# Patient Record
Sex: Female | Born: 1975
Health system: Southern US, Community
[De-identification: ages and names within clinical notes are randomized; demographics above are authoritative.]

## PROBLEM LIST (undated history)

## (undated) DIAGNOSIS — IMO0001 Reserved for inherently not codable concepts without codable children: Secondary | ICD-10-CM

## (undated) DIAGNOSIS — Z332 Encounter for elective termination of pregnancy: Secondary | ICD-10-CM

## (undated) DIAGNOSIS — K219 Gastro-esophageal reflux disease without esophagitis: Secondary | ICD-10-CM

## (undated) DIAGNOSIS — N390 Urinary tract infection, site not specified: Secondary | ICD-10-CM

## (undated) DIAGNOSIS — Z3189 Encounter for other procreative management: Secondary | ICD-10-CM

## (undated) DIAGNOSIS — R896 Abnormal cytological findings in specimens from other organs, systems and tissues: Secondary | ICD-10-CM

## (undated) DIAGNOSIS — F329 Major depressive disorder, single episode, unspecified: Secondary | ICD-10-CM

## (undated) DIAGNOSIS — N970 Female infertility associated with anovulation: Secondary | ICD-10-CM

## (undated) DIAGNOSIS — J302 Other seasonal allergic rhinitis: Secondary | ICD-10-CM

## (undated) DIAGNOSIS — F419 Anxiety disorder, unspecified: Secondary | ICD-10-CM

## (undated) DIAGNOSIS — O26899 Other specified pregnancy related conditions, unspecified trimester: Secondary | ICD-10-CM

## (undated) DIAGNOSIS — IMO0002 Reserved for concepts with insufficient information to code with codable children: Secondary | ICD-10-CM

## (undated) DIAGNOSIS — R12 Heartburn: Secondary | ICD-10-CM

## (undated) DIAGNOSIS — F32A Depression, unspecified: Secondary | ICD-10-CM

## (undated) DIAGNOSIS — O021 Missed abortion: Secondary | ICD-10-CM

## (undated) DIAGNOSIS — R87619 Unspecified abnormal cytological findings in specimens from cervix uteri: Secondary | ICD-10-CM

## (undated) DIAGNOSIS — K649 Unspecified hemorrhoids: Secondary | ICD-10-CM

## (undated) HISTORY — PX: WISDOM TOOTH EXTRACTION: SHX21

## (undated) HISTORY — PX: INDUCED ABORTION: SHX677

## (undated) SURGERY — Surgical Case
Anesthesia: *Unknown

---

## 1998-12-01 ENCOUNTER — Other Ambulatory Visit: Admission: RE | Admit: 1998-12-01 | Discharge: 1998-12-01 | Payer: Self-pay | Admitting: Obstetrics and Gynecology

## 1999-04-28 ENCOUNTER — Other Ambulatory Visit: Admission: RE | Admit: 1999-04-28 | Discharge: 1999-04-28 | Payer: Self-pay | Admitting: Obstetrics and Gynecology

## 2002-03-17 ENCOUNTER — Emergency Department (HOSPITAL_COMMUNITY): Admission: EM | Admit: 2002-03-17 | Discharge: 2002-03-17 | Payer: Self-pay | Admitting: Emergency Medicine

## 2002-09-21 ENCOUNTER — Emergency Department (HOSPITAL_COMMUNITY): Admission: EM | Admit: 2002-09-21 | Discharge: 2002-09-21 | Payer: Self-pay | Admitting: Emergency Medicine

## 2002-09-21 ENCOUNTER — Encounter: Payer: Self-pay | Admitting: Emergency Medicine

## 2003-02-16 ENCOUNTER — Other Ambulatory Visit: Admission: RE | Admit: 2003-02-16 | Discharge: 2003-02-16 | Payer: Self-pay | Admitting: Obstetrics & Gynecology

## 2004-01-28 ENCOUNTER — Other Ambulatory Visit: Admission: RE | Admit: 2004-01-28 | Discharge: 2004-01-28 | Payer: Self-pay | Admitting: Obstetrics & Gynecology

## 2006-10-26 ENCOUNTER — Emergency Department (HOSPITAL_COMMUNITY): Admission: EM | Admit: 2006-10-26 | Discharge: 2006-10-26 | Payer: Self-pay | Admitting: Emergency Medicine

## 2011-02-20 ENCOUNTER — Other Ambulatory Visit (HOSPITAL_COMMUNITY): Payer: Self-pay | Admitting: Obstetrics & Gynecology

## 2011-02-20 DIAGNOSIS — N979 Female infertility, unspecified: Secondary | ICD-10-CM

## 2011-02-27 ENCOUNTER — Ambulatory Visit (HOSPITAL_COMMUNITY)
Admission: RE | Admit: 2011-02-27 | Discharge: 2011-02-27 | Disposition: A | Discharge status: Home / Self Care | Payer: PRIVATE HEALTH INSURANCE | Source: Ambulatory Visit | Attending: Obstetrics & Gynecology | Admitting: Obstetrics & Gynecology

## 2011-02-27 DIAGNOSIS — N979 Female infertility, unspecified: Secondary | ICD-10-CM | POA: Insufficient documentation

## 2012-02-20 ENCOUNTER — Encounter: Payer: PRIVATE HEALTH INSURANCE | Attending: Obstetrics & Gynecology | Admitting: Dietician

## 2012-02-20 DIAGNOSIS — Z713 Dietary counseling and surveillance: Secondary | ICD-10-CM | POA: Insufficient documentation

## 2012-02-20 DIAGNOSIS — O99810 Abnormal glucose complicating pregnancy: Secondary | ICD-10-CM | POA: Insufficient documentation

## 2012-02-21 ENCOUNTER — Encounter: Payer: Self-pay | Admitting: Dietician

## 2012-02-21 NOTE — Progress Notes (Signed)
  Patient was seen on 02/20/2012 for Gestational Diabetes self-management class at the [facility]. The following learning objectives were met by the patient during this course:   States the definition of Gestational Diabetes  States why dietary management is important in controlling blood glucose  Describes the effects each nutrient has on blood glucose levels  Demonstrates ability to create a balanced meal plan  Demonstrates carbohydrate counting   States when to check blood glucose levels  Demonstrates proper blood glucose monitoring techniques  States the effect of stress and exercise on blood glucose levels  States the importance of limiting caffeine and abstaining from alcohol and smoking  Blood glucose monitor given: Accu-Chek Smart View Meter Kit (Accu-Chek Smartview strips and Accu-Chek Fastclix lancets) Lot # [id number] Exp: 06/16/2013 Blood glucose reading: 67 at 10:45 AM  Patient instructed to monitor glucose levels:Fasting and two hours after the first bite of each meal. FBS: 60 - <90 2 hour: <120  Patient received handouts:  Nutrition Diabetes and Pregnancy  Carbohydrate Counting List  Patient will be seen for follow-up as needed.

## 2012-04-29 ENCOUNTER — Encounter (HOSPITAL_COMMUNITY): Payer: Self-pay | Admitting: *Deleted

## 2012-04-29 ENCOUNTER — Inpatient Hospital Stay (HOSPITAL_COMMUNITY)
Admission: AD | Admit: 2012-04-29 | Discharge: 2012-05-02 | DRG: 765 | Disposition: A | Discharge status: Home / Self Care | Payer: PRIVATE HEALTH INSURANCE | Source: Ambulatory Visit | Attending: Obstetrics | Admitting: Obstetrics

## 2012-04-29 ENCOUNTER — Inpatient Hospital Stay (HOSPITAL_COMMUNITY)
Admission: AD | Admit: 2012-04-29 | Discharge: 2012-04-29 | Disposition: A | Discharge status: Hospice | Payer: PRIVATE HEALTH INSURANCE | Source: Ambulatory Visit | Attending: Obstetrics | Admitting: Obstetrics

## 2012-04-29 DIAGNOSIS — O324XX0 Maternal care for high head at term, not applicable or unspecified: Secondary | ICD-10-CM | POA: Diagnosis present

## 2012-04-29 DIAGNOSIS — O99814 Abnormal glucose complicating childbirth: Secondary | ICD-10-CM | POA: Diagnosis present

## 2012-04-29 DIAGNOSIS — D62 Acute posthemorrhagic anemia: Secondary | ICD-10-CM | POA: Diagnosis not present

## 2012-04-29 DIAGNOSIS — O339 Maternal care for disproportion, unspecified: Secondary | ICD-10-CM | POA: Diagnosis present

## 2012-04-29 DIAGNOSIS — O479 False labor, unspecified: Secondary | ICD-10-CM | POA: Insufficient documentation

## 2012-04-29 DIAGNOSIS — O9903 Anemia complicating the puerperium: Secondary | ICD-10-CM | POA: Diagnosis not present

## 2012-04-29 HISTORY — DX: Urinary tract infection, site not specified: N39.0

## 2012-04-29 HISTORY — DX: Abnormal cytological findings in specimens from other organs, systems and tissues: R89.6

## 2012-04-29 HISTORY — DX: Unspecified abnormal cytological findings in specimens from cervix uteri: R87.619

## 2012-04-29 HISTORY — DX: Reserved for inherently not codable concepts without codable children: IMO0001

## 2012-04-29 HISTORY — DX: Reserved for concepts with insufficient information to code with codable children: IMO0002

## 2012-04-29 HISTORY — DX: Major depressive disorder, single episode, unspecified: F32.9

## 2012-04-29 HISTORY — DX: Depression, unspecified: F32.A

## 2012-04-29 HISTORY — DX: Female infertility associated with anovulation: N97.0

## 2012-04-29 HISTORY — DX: Gastro-esophageal reflux disease without esophagitis: K21.9

## 2012-04-29 HISTORY — DX: Encounter for other procreative management: Z31.89

## 2012-04-29 HISTORY — DX: Anxiety disorder, unspecified: F41.9

## 2012-04-29 LAB — OB RESULTS CONSOLE HIV ANTIBODY (ROUTINE TESTING): HIV: NONREACTIVE

## 2012-04-29 LAB — CBC
MCH: 30.6 pg (ref 26.0–34.0)
MCHC: 33.6 g/dL (ref 30.0–36.0)
MCV: 91.1 fL (ref 78.0–100.0)
Platelets: 153 10*3/uL (ref 150–400)
RDW: 15.7 % — ABNORMAL HIGH (ref 11.5–15.5)

## 2012-04-29 LAB — GLUCOSE, CAPILLARY
Glucose-Capillary: 72 mg/dL (ref 70–99)
Glucose-Capillary: 75 mg/dL (ref 70–99)
Glucose-Capillary: 81 mg/dL (ref 70–99)

## 2012-04-29 LAB — OB RESULTS CONSOLE ABO/RH: RH Type: POSITIVE

## 2012-04-29 LAB — OB RESULTS CONSOLE HEPATITIS B SURFACE ANTIGEN: Hepatitis B Surface Ag: NEGATIVE

## 2012-04-29 LAB — TYPE AND SCREEN
ABO/RH(D): A POS
Antibody Screen: NEGATIVE

## 2012-04-29 LAB — RPR: RPR Ser Ql: NONREACTIVE

## 2012-04-29 LAB — OB RESULTS CONSOLE RUBELLA ANTIBODY, IGM: Rubella: IMMUNE

## 2012-04-29 MED ORDER — EPHEDRINE 5 MG/ML INJ: 10.0000 mg | INTRAVENOUS | Status: DC | PRN

## 2012-04-29 MED ORDER — PHENYLEPHRINE 40 MCG/ML (10ML) SYRINGE FOR IV PUSH (FOR BLOOD PRESSURE SUPPORT)
80.0000 ug | PREFILLED_SYRINGE | INTRAVENOUS | Status: DC | PRN
  Administered 2012-04-29: 80 ug via INTRAVENOUS
  Filled 2012-04-29 (×2): qty 5

## 2012-04-29 MED ORDER — ONDANSETRON HCL 4 MG/2ML IJ SOLN: 4.0000 mg | Freq: Four times a day (QID) | INTRAMUSCULAR | Status: DC | PRN

## 2012-04-29 MED ORDER — OXYTOCIN 40 UNITS IN LACTATED RINGERS INFUSION - SIMPLE MED
1.0000 m[IU]/min | INTRAVENOUS | Status: DC
Start: 2012-04-29 — End: 2012-04-30
  Administered 2012-04-29: 2 m[IU]/min via INTRAVENOUS

## 2012-04-29 MED ORDER — LACTATED RINGERS IV SOLN
500.0000 mL | INTRAVENOUS | Status: DC | PRN
  Administered 2012-04-29: 500 mL via INTRAVENOUS

## 2012-04-29 MED ORDER — FENTANYL 2.5 MCG/ML BUPIVACAINE 1/10 % EPIDURAL INFUSION (WH - ANES)
14.0000 mL/h | INTRAMUSCULAR | Status: DC
  Administered 2012-04-29 – 2012-04-30 (×4): 14 mL/h via EPIDURAL
  Filled 2012-04-29 (×5): qty 60

## 2012-04-29 MED ORDER — OXYTOCIN 40 UNITS IN LACTATED RINGERS INFUSION - SIMPLE MED
62.5000 mL/h | Freq: Once | INTRAVENOUS | Status: DC
  Filled 2012-04-29: qty 1000

## 2012-04-29 MED ORDER — TERBUTALINE SULFATE 1 MG/ML IJ SOLN: 0.2500 mg | Freq: Once | INTRAMUSCULAR | Status: AC | PRN

## 2012-04-29 MED ORDER — DIPHENHYDRAMINE HCL 50 MG/ML IJ SOLN: 12.5000 mg | INTRAMUSCULAR | Status: DC | PRN

## 2012-04-29 MED ORDER — SODIUM BICARBONATE 8.4 % IV SOLN
INTRAVENOUS | Status: DC | PRN
  Administered 2012-04-29: 4 mL via EPIDURAL

## 2012-04-29 MED ORDER — IBUPROFEN 600 MG PO TABS: 600.0000 mg | ORAL_TABLET | Freq: Four times a day (QID) | ORAL | Status: DC | PRN

## 2012-04-29 MED ORDER — ACETAMINOPHEN 325 MG PO TABS: 650.0000 mg | ORAL_TABLET | ORAL | Status: DC | PRN

## 2012-04-29 MED ORDER — EPHEDRINE 5 MG/ML INJ
10.0000 mg | INTRAVENOUS | Status: DC | PRN
  Filled 2012-04-29: qty 4

## 2012-04-29 MED ORDER — LACTATED RINGERS IV SOLN
INTRAVENOUS | Status: DC
  Administered 2012-04-29 – 2012-04-30 (×3): via INTRAVENOUS

## 2012-04-29 MED ORDER — OXYTOCIN BOLUS FROM INFUSION
250.0000 mL | Freq: Once | INTRAVENOUS | Status: DC
  Filled 2012-04-29: qty 500

## 2012-04-29 MED ORDER — BUTORPHANOL TARTRATE 1 MG/ML IJ SOLN
INTRAMUSCULAR | Status: AC
  Administered 2012-04-29: 1 mg
  Filled 2012-04-29: qty 1

## 2012-04-29 MED ORDER — PHENYLEPHRINE 40 MCG/ML (10ML) SYRINGE FOR IV PUSH (FOR BLOOD PRESSURE SUPPORT): 80.0000 ug | PREFILLED_SYRINGE | INTRAVENOUS | Status: DC | PRN

## 2012-04-29 MED ORDER — CITRIC ACID-SODIUM CITRATE 334-500 MG/5ML PO SOLN
30.0000 mL | ORAL | Status: DC | PRN
  Administered 2012-04-29 – 2012-04-30 (×2): 30 mL via ORAL
  Filled 2012-04-29 (×2): qty 15

## 2012-04-29 MED ORDER — BUTORPHANOL TARTRATE 1 MG/ML IJ SOLN
1.0000 mg | Freq: Once | INTRAMUSCULAR | Status: AC
  Administered 2012-04-29: 1 mg via INTRAVENOUS
  Filled 2012-04-29: qty 1

## 2012-04-29 MED ORDER — LACTATED RINGERS IV SOLN
500.0000 mL | Freq: Once | INTRAVENOUS | Status: AC
  Administered 2012-04-29: 14:00:00 via INTRAVENOUS

## 2012-04-29 MED ORDER — FENTANYL 2.5 MCG/ML BUPIVACAINE 1/10 % EPIDURAL INFUSION (WH - ANES)
INTRAMUSCULAR | Status: DC | PRN
  Administered 2012-04-29: 14 mL/h via EPIDURAL

## 2012-04-29 MED ORDER — LIDOCAINE HCL (PF) 1 % IJ SOLN
30.0000 mL | INTRAMUSCULAR | Status: DC | PRN
  Filled 2012-04-29: qty 30

## 2012-04-29 MED ORDER — OXYCODONE-ACETAMINOPHEN 5-325 MG PO TABS: 1.0000 | ORAL_TABLET | ORAL | Status: DC | PRN

## 2012-04-29 MED ORDER — FLEET ENEMA 7-19 GM/118ML RE ENEM: 1.0000 | ENEMA | RECTAL | Status: DC | PRN

## 2012-04-29 MED ORDER — ZOLPIDEM TARTRATE 5 MG PO TABS
5.0000 mg | ORAL_TABLET | Freq: Once | ORAL | Status: AC
  Administered 2012-04-29: 5 mg via ORAL
  Filled 2012-04-29: qty 1

## 2012-04-29 NOTE — Anesthesia Procedure Notes (Signed)

## 2012-04-29 NOTE — Progress Notes (Signed)
JERRE DIGUGLIELMO is a 36 y.o. G2P0010 at [redacted]w[redacted]d by LMP admitted for active labor  Subjective: occ pressure  Objective: BP 125/88  Pulse 98  Temp 98.9 F (37.2 C) (Axillary)  Resp 20  Ht 5' 1.5" (1.562 m)  Wt 68.675 kg (151 lb 6.4 oz)  BMI 28.14 kg/m2  SpO2 100%  LMP 02/21/2011 I/O last 3 completed shifts: In: -  Out: 800 [Urine:800]    FHT:  FHR: 155 bpm, variability: moderate,  accelerations:  Present,  decelerations:  Absent UC:   irregular, every 2-5 minutes SVE:   8-9/100/+1/LOA  Labs: Lab Results  Component Value Date   WBC 11.6* 04/29/2012   HGB 12.4 04/29/2012   HCT 36.9 04/29/2012   MCV 91.1 04/29/2012   PLT 153 04/29/2012    Assessment / Plan: Augmentation of labor, progressing well  Labor: Progressing normally Preeclampsia:  na Fetal Wellbeing:  Category I Pain Control:  Epidural I/D:  n/a Anticipated MOD:  NSVD  Inette Doubrava J 04/29/2012, 8:36 PM

## 2012-04-29 NOTE — MAU Note (Signed)
Pt c/o irregular contractions for the past 15 hours.

## 2012-04-29 NOTE — Progress Notes (Signed)
EVALEIGH MCCAMY is a 36 y.o. G2P0010 at [redacted]w[redacted]d by LMP admitted for active labor  Subjective: Comfortable with epidural  Objective: BP 117/72  Pulse 88  Temp 98.6 F (37 C) (Oral)  Resp 18  Ht 5' 1.5" (1.562 m)  Wt 68.675 kg (151 lb 6.4 oz)  BMI 28.14 kg/m2  SpO2 100%  LMP 02/21/2011      FHT:  FHR: 155 bpm, variability: moderate,  accelerations:  Present,  decelerations:  Absent UC:   irregular, every 3-6 minutes SVE:   5/90/0 AROM- clear  Labs: Lab Results  Component Value Date   WBC 11.6* 04/29/2012   HGB 12.4 04/29/2012   HCT 36.9 04/29/2012   MCV 91.1 04/29/2012   PLT 153 04/29/2012    Assessment / Plan: Spontaneous labor, progressing normally- contractions irregular, will augment  Labor: Augment Preeclampsia:  na Fetal Wellbeing:  Category I Pain Control:  Epidural I/D:  n/a Anticipated MOD:  NSVD  Woodard Perrell J 04/29/2012, 3:34 PM

## 2012-04-29 NOTE — MAU Note (Signed)
Patient states she is having contractions every 8 minutes with some discharge.

## 2012-04-29 NOTE — Anesthesia Preprocedure Evaluation (Addendum)

## 2012-04-29 NOTE — MAU Note (Signed)
Contractions, pinkish discharge 

## 2012-04-29 NOTE — H&P (Signed)
Chief complaint: Labor contractions  HPI:  36 year old G1 at 40 weeks and 2 days who presents with contractions for the past 24 hours. Patient notes over the last hour prior to admission contractions were coming every 3-5 minutes and lasting 1 minutes and were moderate and strength. Patient notes good fetal movement. No leakage of fluid, scant vaginal spotting.  Patient does note through the hour that she has been in tree as her contractions have spaced out and are not as strong  Obstetric history: Gestational diabetes, diet controlled, normal blood sugar logs which were reviewed at all visits. Normal fetal testing. Normal fetal growth.  Allergies: Penicillin, sulfa  Physical exam: Filed Vitals:   04/29/12 0031  BP: 126/82  Pulse: 80  Resp: 20  Height: 5' 1.5" (1.562 m)  Weight: 69.854 kg (154 lb)  SpO2: 100%   General: Well appearing, no distress, patient observed through contraction with minimal appearance of pain Abdomen: Nontender, nondistended, gravid, EFW 7-1/2 pounds Cervix: Exam per nurse: 3 cm, 70% dilated, vertex Lower extremities: Nontender, no edema Toco: Irregular contractions Q3 to 8 minutes FH: Baseline 130s, positive accelerations, no decelerations, 10 beat variability  Assessment and plan: This is a 36 year old G1 at 40 weeks and 2 days who presents with 24 hours of contractions but no significant cervical change from her prior exam in the office Latent labor. Plan expectant management. Recommend discharge to home and return when contractions worsening. Patient has followup visit in the office in 2 days  Fetal well being: Reactive testing  Disposition. Home with Ambien to help sleep  Saranne Crislip A. 04/29/2012 1:30 AM

## 2012-04-29 NOTE — MAU Note (Signed)
Contractions more intense, pinkish d/c yesterday, now more red.

## 2012-04-29 NOTE — H&P (Signed)
Kimberly Nelson is a 36 y.o. G2P0010 at [redacted]w[redacted]d presenting for labor. Pt notes increasingly strong contractions over the past 24 hrs. Pt seen here early this am and d/c home in latent labor with Ambien. Pt notes no sleep due to ctx pain. Good fetal movement, No vaginal bleeding, not leaking fluid.  [facility] at [facility] since 1st trimester - depression, anxiety, controlled on Zoloft 50mg  - Previa, resolved by 30 wks - GDM, A1 - fetal growth, 35 wk u/s showing AC at 99%, repeat u/s at 37 wks, baby 74%  OB History    Grav Para Term Preterm Abortions TAB SAB Ect Mult Living   2 0 0 0 1 1 0 0 0 0     TOP x 1  Past Medical History  Diagnosis Date  . Diabetes mellitus   . Gestational diabetes     diet controlled  . Urinary tract infection   . Anxiety     on meds, doing well  . Abnormal Pap smear     colpo, ok since   Past Surgical History  Procedure Date  . Induced abortion    Family History: family history includes Cancer in her other; Hyperlipidemia in her other; and Hypertension in her other.  There is no history of Other. Social History:  reports that she quit smoking about 12 years ago. Her smoking use included Cigarettes. She smoked .5 packs per day. She has never used smokeless tobacco. She reports that she drinks alcohol. She reports that she does not use illicit drugs.  Review of Systems - Negative except unable to sleep, worsening contractions   Dilation: 4.5 Effacement (%): 80 Station: -1;-2 Exam by:: Dr Ernestina Penna Blood pressure 122/86, pulse 89, temperature 98.1 F (36.7 C), temperature source Oral, resp. rate 20, height 5' 1.5" (1.562 m), weight 68.675 kg (151 lb 6.4 oz), last menstrual period 02/21/2011, SpO2 100.00%.  Physical Exam:  Gen: well appearing, no distress, breathing through contractions CV: RRR Pulm: CTAB Back: no CVAT Abd: gravid, NT, no RUQ pain, EFW 8# LE: no edema, equal bilaterally, non-tender Toco: irregular q 3-7 FH: baseline 140s,  accelerations present, single deceleratons, 10 beat variability  Prenatal labs: ABO, Rh:  A+ Antibody:  neg Rubella:  Immune RPR:   NR HBsAg:   neg HIV:   neg GBS:   neg 1 hr Glucola failed- dx w/ GDM  Genetic screening normal NT Anatomy US normal   Assessment/Plan: 36 y.o. G2P0010 at 101w3d Labor, latent phase but starting to enter active phase. Admit, epidural, rest. Plan expectant management. Pt aware that is she does not make further progress she will have AROM and pitocin. Pt agreeable as was planning IOL tomorrow.  - GDM. Check BS q 2 hrs while in active labor - GBS neg - anxiety/ depression, Cont Zoloft  Kimberly Nelson A. 04/29/2012 10:06 AM     Kimberly Nelson A. 04/29/2012, 9:54 AM

## 2012-04-30 ENCOUNTER — Encounter (HOSPITAL_COMMUNITY): Payer: Self-pay | Admitting: Anesthesiology

## 2012-04-30 ENCOUNTER — Encounter (HOSPITAL_COMMUNITY): Payer: Self-pay | Admitting: *Deleted

## 2012-04-30 ENCOUNTER — Encounter (HOSPITAL_COMMUNITY)
Admission: AD | Disposition: A | Discharge status: Home / Self Care | Payer: Self-pay | Source: Ambulatory Visit | Attending: Obstetrics

## 2012-04-30 ENCOUNTER — Inpatient Hospital Stay (HOSPITAL_COMMUNITY): Payer: PRIVATE HEALTH INSURANCE | Admitting: Anesthesiology

## 2012-04-30 SURGERY — Surgical Case
Anesthesia: *Unknown

## 2012-04-30 SURGERY — Surgical Case
Anesthesia: Regional | Site: Abdomen | Wound class: Clean Contaminated

## 2012-04-30 MED ORDER — MENTHOL 3 MG MT LOZG: 1.0000 | LOZENGE | OROMUCOSAL | Status: DC | PRN

## 2012-04-30 MED ORDER — DEXTROSE 5 % IV SOLN
2.0000 g | INTRAVENOUS | Status: DC | PRN
  Administered 2012-04-30: 2 g via INTRAVENOUS

## 2012-04-30 MED ORDER — MORPHINE SULFATE (PF) 0.5 MG/ML IJ SOLN
INTRAMUSCULAR | Status: DC | PRN
  Administered 2012-04-30: 1 mg via INTRAVENOUS
  Administered 2012-04-30: 4 mg via EPIDURAL

## 2012-04-30 MED ORDER — NALOXONE HCL 0.4 MG/ML IJ SOLN: 0.4000 mg | INTRAMUSCULAR | Status: DC | PRN

## 2012-04-30 MED ORDER — SODIUM BICARBONATE 8.4 % IV SOLN
INTRAVENOUS | Status: AC
  Filled 2012-04-30: qty 50

## 2012-04-30 MED ORDER — SCOPOLAMINE 1 MG/3DAYS TD PT72
MEDICATED_PATCH | TRANSDERMAL | Status: AC
  Filled 2012-04-30: qty 1

## 2012-04-30 MED ORDER — ONDANSETRON HCL 4 MG/2ML IJ SOLN
INTRAMUSCULAR | Status: DC | PRN
  Administered 2012-04-30: 4 mg via INTRAVENOUS

## 2012-04-30 MED ORDER — FENTANYL CITRATE 0.05 MG/ML IJ SOLN: 25.0000 ug | INTRAMUSCULAR | Status: DC | PRN

## 2012-04-30 MED ORDER — DIPHENHYDRAMINE HCL 25 MG PO CAPS: 25.0000 mg | ORAL_CAPSULE | ORAL | Status: DC | PRN

## 2012-04-30 MED ORDER — KETOROLAC TROMETHAMINE 30 MG/ML IJ SOLN
30.0000 mg | Freq: Four times a day (QID) | INTRAMUSCULAR | Status: AC | PRN
  Administered 2012-04-30: 30 mg via INTRAMUSCULAR

## 2012-04-30 MED ORDER — MORPHINE SULFATE 0.5 MG/ML IJ SOLN
INTRAMUSCULAR | Status: AC
  Filled 2012-04-30: qty 10

## 2012-04-30 MED ORDER — OXYCODONE-ACETAMINOPHEN 5-325 MG PO TABS
1.0000 | ORAL_TABLET | ORAL | Status: DC | PRN
  Administered 2012-04-30: 1 via ORAL
  Administered 2012-05-01 (×4): 2 via ORAL
  Administered 2012-05-02: 1 via ORAL
  Administered 2012-05-02 (×2): 2 via ORAL
  Filled 2012-04-30 (×5): qty 2
  Filled 2012-04-30 (×2): qty 1
  Filled 2012-04-30: qty 2
  Filled 2012-04-30: qty 1

## 2012-04-30 MED ORDER — MEPERIDINE HCL 25 MG/ML IJ SOLN
INTRAMUSCULAR | Status: DC | PRN
  Administered 2012-04-30: 25 mg via INTRAVENOUS

## 2012-04-30 MED ORDER — MEPERIDINE HCL 25 MG/ML IJ SOLN: 6.2500 mg | INTRAMUSCULAR | Status: DC | PRN

## 2012-04-30 MED ORDER — NALBUPHINE HCL 10 MG/ML IJ SOLN
5.0000 mg | INTRAMUSCULAR | Status: DC | PRN
  Filled 2012-04-30: qty 1

## 2012-04-30 MED ORDER — DIPHENHYDRAMINE HCL 50 MG/ML IJ SOLN: 25.0000 mg | INTRAMUSCULAR | Status: DC | PRN

## 2012-04-30 MED ORDER — METOCLOPRAMIDE HCL 5 MG/ML IJ SOLN: 10.0000 mg | Freq: Three times a day (TID) | INTRAMUSCULAR | Status: DC | PRN

## 2012-04-30 MED ORDER — BUTORPHANOL TARTRATE 1 MG/ML IJ SOLN: 0.5000 mg | Freq: Once | INTRAMUSCULAR | Status: DC

## 2012-04-30 MED ORDER — ONDANSETRON HCL 4 MG/2ML IJ SOLN: 4.0000 mg | Freq: Three times a day (TID) | INTRAMUSCULAR | Status: DC | PRN

## 2012-04-30 MED ORDER — TETANUS-DIPHTH-ACELL PERTUSSIS 5-2.5-18.5 LF-MCG/0.5 IM SUSP: 0.5000 mL | Freq: Once | INTRAMUSCULAR | Status: DC

## 2012-04-30 MED ORDER — ACETAMINOPHEN 10 MG/ML IV SOLN: 1000.0000 mg | Freq: Four times a day (QID) | INTRAVENOUS | Status: AC | PRN

## 2012-04-30 MED ORDER — SERTRALINE HCL 50 MG PO TABS
75.0000 mg | ORAL_TABLET | Freq: Every day | ORAL | Status: DC
  Administered 2012-04-30 – 2012-05-01 (×2): 75 mg via ORAL
  Filled 2012-04-30 (×4): qty 1

## 2012-04-30 MED ORDER — LANOLIN HYDROUS EX OINT: 1.0000 "application " | TOPICAL_OINTMENT | CUTANEOUS | Status: DC | PRN

## 2012-04-30 MED ORDER — LIDOCAINE-EPINEPHRINE (PF) 2 %-1:200000 IJ SOLN
INTRAMUSCULAR | Status: AC
  Filled 2012-04-30: qty 20

## 2012-04-30 MED ORDER — DIPHENHYDRAMINE HCL 50 MG/ML IJ SOLN: 12.5000 mg | INTRAMUSCULAR | Status: DC | PRN

## 2012-04-30 MED ORDER — WITCH HAZEL-GLYCERIN EX PADS: 1.0000 "application " | MEDICATED_PAD | CUTANEOUS | Status: DC | PRN

## 2012-04-30 MED ORDER — METOCLOPRAMIDE HCL 5 MG/ML IJ SOLN
INTRAMUSCULAR | Status: DC | PRN
  Administered 2012-04-30: 10 mg via INTRAVENOUS

## 2012-04-30 MED ORDER — SCOPOLAMINE 1 MG/3DAYS TD PT72
1.0000 | MEDICATED_PATCH | Freq: Once | TRANSDERMAL | Status: DC
  Administered 2012-04-30: 1.5 mg via TRANSDERMAL

## 2012-04-30 MED ORDER — SIMETHICONE 80 MG PO CHEW
80.0000 mg | CHEWABLE_TABLET | Freq: Three times a day (TID) | ORAL | Status: DC
  Administered 2012-04-30 – 2012-05-02 (×7): 80 mg via ORAL

## 2012-04-30 MED ORDER — DIBUCAINE 1 % RE OINT: 1.0000 "application " | TOPICAL_OINTMENT | RECTAL | Status: DC | PRN

## 2012-04-30 MED ORDER — DIPHENHYDRAMINE HCL 25 MG PO CAPS: 25.0000 mg | ORAL_CAPSULE | Freq: Four times a day (QID) | ORAL | Status: DC | PRN

## 2012-04-30 MED ORDER — IBUPROFEN 600 MG PO TABS
600.0000 mg | ORAL_TABLET | Freq: Four times a day (QID) | ORAL | Status: DC
  Administered 2012-04-30 – 2012-05-02 (×8): 600 mg via ORAL
  Filled 2012-04-30 (×8): qty 1

## 2012-04-30 MED ORDER — METHYLERGONOVINE MALEATE 0.2 MG PO TABS: 0.2000 mg | ORAL_TABLET | ORAL | Status: DC | PRN

## 2012-04-30 MED ORDER — BUPIVACAINE HCL (PF) 0.25 % IJ SOLN
INTRAMUSCULAR | Status: AC
  Filled 2012-04-30: qty 30

## 2012-04-30 MED ORDER — LACTATED RINGERS IV SOLN
INTRAVENOUS | Status: DC | PRN
  Administered 2012-04-30: 07:00:00 via INTRAVENOUS

## 2012-04-30 MED ORDER — SIMETHICONE 80 MG PO CHEW: 80.0000 mg | CHEWABLE_TABLET | ORAL | Status: DC | PRN

## 2012-04-30 MED ORDER — MIDAZOLAM HCL 2 MG/2ML IJ SOLN: 0.5000 mg | Freq: Once | INTRAMUSCULAR | Status: DC | PRN

## 2012-04-30 MED ORDER — KETOROLAC TROMETHAMINE 30 MG/ML IJ SOLN
30.0000 mg | Freq: Four times a day (QID) | INTRAMUSCULAR | Status: AC | PRN
  Administered 2012-04-30: 30 mg via INTRAVENOUS
  Filled 2012-04-30: qty 1

## 2012-04-30 MED ORDER — METHYLERGONOVINE MALEATE 0.2 MG/ML IJ SOLN: 0.2000 mg | INTRAMUSCULAR | Status: DC | PRN

## 2012-04-30 MED ORDER — 0.9 % SODIUM CHLORIDE (POUR BTL) OPTIME
TOPICAL | Status: DC | PRN
  Administered 2012-04-30: 1000 mL

## 2012-04-30 MED ORDER — BUPIVACAINE HCL (PF) 0.25 % IJ SOLN
INTRAMUSCULAR | Status: DC | PRN
  Administered 2012-04-30: 30 mL

## 2012-04-30 MED ORDER — SENNOSIDES-DOCUSATE SODIUM 8.6-50 MG PO TABS
2.0000 | ORAL_TABLET | Freq: Every day | ORAL | Status: DC
  Administered 2012-04-30 – 2012-05-01 (×2): 2 via ORAL

## 2012-04-30 MED ORDER — METOCLOPRAMIDE HCL 5 MG/ML IJ SOLN
INTRAMUSCULAR | Status: AC
  Filled 2012-04-30: qty 2

## 2012-04-30 MED ORDER — OXYTOCIN 10 UNIT/ML IJ SOLN
40.0000 [IU] | INTRAVENOUS | Status: DC | PRN
  Administered 2012-04-30: 40 [IU] via INTRAVENOUS

## 2012-04-30 MED ORDER — MEPERIDINE HCL 25 MG/ML IJ SOLN
INTRAMUSCULAR | Status: AC
  Filled 2012-04-30: qty 1

## 2012-04-30 MED ORDER — DEXTROSE 5 % IV SOLN
2.0000 g | INTRAVENOUS | Status: DC
  Filled 2012-04-30: qty 2

## 2012-04-30 MED ORDER — ZOLPIDEM TARTRATE 5 MG PO TABS: 5.0000 mg | ORAL_TABLET | Freq: Every evening | ORAL | Status: DC | PRN

## 2012-04-30 MED ORDER — ONDANSETRON HCL 4 MG/2ML IJ SOLN
INTRAMUSCULAR | Status: AC
  Filled 2012-04-30: qty 2

## 2012-04-30 MED ORDER — PRENATAL MULTIVITAMIN CH
1.0000 | ORAL_TABLET | Freq: Every day | ORAL | Status: DC
  Administered 2012-04-30 – 2012-05-01 (×2): 1 via ORAL
  Filled 2012-04-30 (×3): qty 1

## 2012-04-30 MED ORDER — PROMETHAZINE HCL 25 MG/ML IJ SOLN: 6.2500 mg | INTRAMUSCULAR | Status: DC | PRN

## 2012-04-30 MED ORDER — OXYTOCIN 10 UNIT/ML IJ SOLN
INTRAMUSCULAR | Status: AC
  Filled 2012-04-30: qty 4

## 2012-04-30 MED ORDER — ONDANSETRON HCL 4 MG PO TABS: 4.0000 mg | ORAL_TABLET | ORAL | Status: DC | PRN

## 2012-04-30 MED ORDER — ONDANSETRON HCL 4 MG/2ML IJ SOLN: 4.0000 mg | INTRAMUSCULAR | Status: DC | PRN

## 2012-04-30 MED ORDER — KETOROLAC TROMETHAMINE 30 MG/ML IJ SOLN
INTRAMUSCULAR | Status: AC
  Administered 2012-04-30: 30 mg via INTRAMUSCULAR
  Filled 2012-04-30: qty 1

## 2012-04-30 MED ORDER — SODIUM CHLORIDE 0.9 % IV SOLN: 1.0000 ug/kg/h | INTRAVENOUS | Status: DC | PRN

## 2012-04-30 MED ORDER — PHENYLEPHRINE 40 MCG/ML (10ML) SYRINGE FOR IV PUSH (FOR BLOOD PRESSURE SUPPORT)
PREFILLED_SYRINGE | INTRAVENOUS | Status: AC
  Filled 2012-04-30: qty 5

## 2012-04-30 MED ORDER — LACTATED RINGERS IV SOLN
INTRAVENOUS | Status: DC | PRN
  Administered 2012-04-30 (×2): via INTRAVENOUS

## 2012-04-30 MED ORDER — SODIUM CHLORIDE 0.9 % IJ SOLN: 3.0000 mL | INTRAMUSCULAR | Status: DC | PRN

## 2012-04-30 MED ORDER — LACTATED RINGERS IV SOLN
INTRAVENOUS | Status: DC
  Administered 2012-04-30: 17:00:00 via INTRAVENOUS

## 2012-04-30 MED ORDER — OXYTOCIN 40 UNITS IN LACTATED RINGERS INFUSION - SIMPLE MED: 62.5000 mL/h | INTRAVENOUS | Status: AC

## 2012-04-30 SURGICAL SUPPLY — 29 items
CLOTH BEACON ORANGE TIMEOUT ST (SAFETY) ×2 IMPLANT
CONTAINER PREFILL 10% NBF 15ML (MISCELLANEOUS) IMPLANT
DRESSING TELFA 8X3 (GAUZE/BANDAGES/DRESSINGS) IMPLANT
DRSG COVADERM 4X10 (GAUZE/BANDAGES/DRESSINGS) ×2 IMPLANT
ELECT REM PT RETURN 9FT ADLT (ELECTROSURGICAL) ×2
ELECTRODE REM PT RTRN 9FT ADLT (ELECTROSURGICAL) ×1 IMPLANT
EXTRACTOR VACUUM M CUP 4 TUBE (SUCTIONS) IMPLANT
GAUZE SPONGE 4X4 12PLY STRL LF (GAUZE/BANDAGES/DRESSINGS) ×4 IMPLANT
GLOVE BIO SURGEON STRL SZ7.5 (GLOVE) ×4 IMPLANT
GOWN PREVENTION PLUS LG XLONG (DISPOSABLE) ×4 IMPLANT
GOWN PREVENTION PLUS XLARGE (GOWN DISPOSABLE) ×2 IMPLANT
KIT ABG SYR 3ML LUER SLIP (SYRINGE) IMPLANT
NEEDLE HYPO 25X1 1.5 SAFETY (NEEDLE) ×2 IMPLANT
NEEDLE HYPO 25X5/8 SAFETYGLIDE (NEEDLE) IMPLANT
NS IRRIG 1000ML POUR BTL (IV SOLUTION) ×2 IMPLANT
PACK C SECTION WH (CUSTOM PROCEDURE TRAY) ×2 IMPLANT
PAD ABD 7.5X8 STRL (GAUZE/BANDAGES/DRESSINGS) IMPLANT
SLEEVE SCD COMPRESS KNEE MED (MISCELLANEOUS) ×2 IMPLANT
STAPLER VISISTAT 35W (STAPLE) ×2 IMPLANT
SUT MNCRL 0 VIOLET CTX 36 (SUTURE) ×3 IMPLANT
SUT MON AB 2-0 CT1 27 (SUTURE) ×2 IMPLANT
SUT MON AB-0 CT1 36 (SUTURE) ×4 IMPLANT
SUT MONOCRYL 0 CTX 36 (SUTURE) ×3
SUT PLAIN 0 NONE (SUTURE) IMPLANT
SUT PLAIN 2 0 XLH (SUTURE) IMPLANT
SYR CONTROL 10ML LL (SYRINGE) ×2 IMPLANT
TOWEL OR 17X24 6PK STRL BLUE (TOWEL DISPOSABLE) ×4 IMPLANT
TRAY FOLEY CATH 14FR (SET/KITS/TRAYS/PACK) ×2 IMPLANT
WATER STERILE IRR 1000ML POUR (IV SOLUTION) ×2 IMPLANT

## 2012-04-30 NOTE — Anesthesia Postprocedure Evaluation (Signed)
  Anesthesia Post Note  Patient: Kimberly Nelson  Procedure(s) Performed: Procedure(s) (LRB): CESAREAN SECTION (N/A)  Anesthesia type: Epidural  Patient location: Mother/Baby  Post pain: Pain level controlled  Post assessment: Post-op Vital signs reviewed  Last Vitals:  Filed Vitals:   04/30/12 1646  BP: 90/55  Pulse: 74  Temp: 36.3 C  Resp: 20    Post vital signs: Reviewed  Level of consciousness:alert  Complications: No apparent anesthesia complications

## 2012-04-30 NOTE — Anesthesia Postprocedure Evaluation (Signed)
  Anesthesia Post-op Note  Patient: Kimberly Nelson  Procedure(s) Performed: Procedure(s) (LRB): CESAREAN SECTION (N/A)  Patient Location: PACU  Anesthesia Type: Epidural  Level of Consciousness: awake, alert  and oriented  Airway and Oxygen Therapy: Patient Spontanous Breathing  Post-op Pain: none  Post-op Assessment: Post-op Vital signs reviewed, Patient's Cardiovascular Status Stable, Respiratory Function Stable, Patent Airway, No signs of Nausea or vomiting, Pain level controlled, No headache, No backache, No residual numbness and No residual motor weakness  Post-op Vital Signs: Reviewed and stable  Complications: No apparent anesthesia complications

## 2012-04-30 NOTE — Addendum Note (Signed)
Addendum  created 04/30/12 1652 by Algis Greenhouse, CRNA   Modules edited:Notes Section

## 2012-04-30 NOTE — Progress Notes (Signed)
Dr. Billy Coast discussed plan of care with pt. And husband to proceed with c-section. Discussed risks with pt and husband

## 2012-04-30 NOTE — Op Note (Signed)
Cesarean Section Procedure Note  Indications: cephalo-pelvic disproportion and failure to progress: arrest of descent  Pre-operative Diagnosis: 40 week 4 day pregnancy.  Post-operative Diagnosis: same  Surgeon: Lenoard Aden   Assistants: none  Anesthesia: Epidural anesthesia and Local anesthesia 0.25.% bupivacaine  ASA Class: 2  Procedure Details  The patient was seen in the Holding Room. The risks, benefits, complications, treatment options, and expected outcomes were discussed with the patient.  The patient concurred with the proposed plan, giving informed consent. The risks of anesthesia, infection, bleeding and possible injury to other organs discussed. Injury to bowel, bladder, or ureter with possible need for repair discussed. Possible need for transfusion with secondary risks of hepatitis or HIV acquisition discussed. Post operative complications to include but not limited to DVT, PE and Pneumonia noted. The site of surgery properly noted/marked. The patient was taken to Operating Room # 1, identified as Kimberly Nelson and the procedure verified as C-Section Delivery. A Time Out was held and the above information confirmed.  After induction of anesthesia, the patient was draped and prepped in the usual sterile manner. A Pfannenstiel incision was made and carried down through the subcutaneous tissue to the fascia. Fascial incision was made and extended transversely using Mayo scissors. The fascia was separated from the underlying rectus tissue superiorly and inferiorly. The peritoneum was identified and entered. Peritoneal incision was extended longitudinally. The utero-vesical peritoneal reflection was incised transversely and the bladder flap was bluntly freed from the lower uterine segment. A low transverse uterine incision(Kerr hysterotomy) was made. Delivered from LOP presentation was a  female with Apgar scores of 9 at one minute and 9 at five minutes. Bulb suctioning gently  performed. Neonatal team in attendance.After the umbilical cord was clamped and cut cord blood was obtained for evaluation. The placenta was removed intact and appeared normal. The uterus was curetted with a dry lap pack. Good hemostasis was noted.The uterine outline, tubes and ovaries appeared normal. The uterine incision was closed with running locked sutures of 0 Monocryl x 2 layers.  O Leary stitch x one on right uterine artery for hemostasis.Hemostasis was observed. Lavage was carried out until clear.The parietal peritoneum was closed with a running 2-0 Monocryl suture. The fascia was then reapproximated with running sutures of 0 Monocryl. The skin was reapproximated with staples.  Instrument, sponge, and needle counts were correct prior the abdominal closure and at the conclusion of the case.   Findings: above  Estimated Blood Loss:  400         Drains: foley                 Specimens: none                 Complications:  None; patient tolerated the procedure well.         Disposition: PACU - hemodynamically stable.         Condition: stable  Attending Attestation: I performed the procedure.

## 2012-04-30 NOTE — Progress Notes (Signed)
Kimberly Nelson is a 36 y.o. G2P0010 at [redacted]w[redacted]d by LMP admitted for active labor  Subjective: Pushing x 3 hours  Objective: BP 120/83  Pulse 110  Temp 99.2 F (37.3 C) (Oral)  Resp 18  Ht 5' 1.5" (1.562 m)  Wt 68.675 kg (151 lb 6.4 oz)  BMI 28.14 kg/m2  SpO2 100%  LMP 02/21/2011 I/O last 3 completed shifts: In: -  Out: 800 [Urine:800] Total I/O In: -  Out: 400 [Urine:400]  FHT:  FHR: 150-160 bpm, variability: moderate,  accelerations:  Present,  decelerations:  Absent UC:   regular, every 2 minutes SVE:   Dilation: 10 Effacement (%): 100 Station: +1 Exam by:: [employer] RN Contractions adequate by IUPC No descent x 3 hours  Labs: Lab Results  Component Value Date   WBC 11.6* 04/29/2012   HGB 12.4 04/29/2012   HCT 36.9 04/29/2012   MCV 91.1 04/29/2012   PLT 153 04/29/2012    Assessment / Plan: Arrest of decent  Labor: failure to descend Preeclampsia:  na Fetal Wellbeing:  Category I Pain Control:  Epidural I/D:  n/a Anticipated MOD:  Proceed with cesarean section Risks vs benefits discussed.  Kimberly Nelson J 04/30/2012, 6:18 AM

## 2012-04-30 NOTE — Transfer of Care (Signed)
Immediate Anesthesia Transfer of Care Note  Patient: Kimberly Nelson  Procedure(s) Performed: Procedure(s) (LRB): CESAREAN SECTION (N/A)  Patient Location: PACU  Anesthesia Type: Epidural  Level of Consciousness: awake, alert  and oriented  Airway & Oxygen Therapy: Patient Spontanous Breathing  Post-op Assessment: Report given to PACU RN and Post -op Vital signs reviewed and stable  Post vital signs: Reviewed and stable  Complications: No apparent anesthesia complications

## 2012-05-01 ENCOUNTER — Encounter (HOSPITAL_COMMUNITY): Payer: Self-pay | Admitting: Obstetrics and Gynecology

## 2012-05-01 LAB — CBC
MCHC: 34.1 g/dL (ref 30.0–36.0)
RDW: 15.9 % — ABNORMAL HIGH (ref 11.5–15.5)

## 2012-05-01 NOTE — Progress Notes (Signed)
POD # 1  Subjective: Pt reports feeling well/ Pain controlled with motrin and percocet Tolerating po/ Foley d/c'ed and pt  voiding without problems/ No n/v/Flatus pos Activity: out of bed and ambulate Bleeding is light Newborn info:  Information for the patient's newborn:  Kyia, Rhude [[id number]]  female  / circ pending/ Feeding: breast   Objective: VS: Blood pressure 91/58, pulse 74, temperature 98.2 F (36.8 C), temperature source Oral, resp. rate 18, height 5' 1.5" (1.562 m), weight 151 lb 6.4 oz (68.675 kg), last menstrual period 02/21/2011, SpO2 95.00%, unknown if currently breastfeeding.    Intake/Output Summary (Last 24 hours) at 05/01/12 1017 Last data filed at 05/01/12 0003  Gross per 24 hour  Intake    940 ml  Output   1800 ml  Net   -860 ml      Basename 05/01/12 0515 04/29/12 1105  WBC 18.9* 11.6*  HGB 9.3* 12.4  HCT 27.3* 36.9  PLT 154 153    Blood type: A/Positive/-- (08/13 1308) Rubella: Immune (08/13 1308)    Physical Exam:  General: alert, cooperative and no distress CV: Regular rate and rhythm Resp: clear Abdomen: soft, nontender, normal bowel sounds Incision: covered with dressing/ C/D/I Uterine Fundus: firm, below umbilicus, nontender Lochia: minimal Ext: edema trace and Homans sign is negative, no sign of DVT    A/P: POD # 1/ G2P1011 S/P Primary C/Section d/t FTD Doing well and will consider early d/c tomorrow. Continue routine post op orders   Signed: Demetrius Revel, MSN, [facility] 05/01/2012, 10:17 AM

## 2012-05-02 MED ORDER — DOCUSATE SODIUM 100 MG PO CAPS: 100.0000 mg | ORAL_CAPSULE | Freq: Two times a day (BID) | ORAL | Status: DC

## 2012-05-02 MED ORDER — IBUPROFEN 600 MG PO TABS: 600.0000 mg | ORAL_TABLET | Freq: Four times a day (QID) | ORAL | Status: AC

## 2012-05-02 MED ORDER — FE BISGLYCINATE-FE POLYSAC 40-20 MG PO CAPS: 1.0000 | ORAL_CAPSULE | Freq: Every day | ORAL | Status: DC

## 2012-05-02 MED ORDER — OXYCODONE-ACETAMINOPHEN 5-325 MG PO TABS: 1.0000 | ORAL_TABLET | Freq: Four times a day (QID) | ORAL | Status: AC | PRN

## 2012-05-02 NOTE — Discharge Summary (Signed)
Obstetric Discharge Summary Reason for Admission: onset of labor and Term gestation @ 43w 3d, hx GDM (A1) Prenatal Procedures: NST and ultrasound Intrapartum Procedures: cesarean: low cervical, transverse Postpartum Procedures: none Complications-Operative and Postpartum: none Hemoglobin  Date Value Range Status  05/01/2012 9.3* 12.0 - 15.0 g/dL Final     REPEATED TO VERIFY     DELTA CHECK NOTED     HCT  Date Value Range Status  05/01/2012 27.3* 36.0 - 46.0 % Final    Physical Exam:  General: alert, cooperative and no distress Lochia: appropriate Uterine Fundus: firm Incision: healing well, staples well approximated DVT Evaluation: No evidence of DVT seen on physical exam.  Discharge Diagnoses: Term Pregnancy-delivered and by C/S due to FTP  Discharge Information: Date: 05/02/2012 Activity: pelvic rest Diet: routine Medications: Ibuprofen, Colace, Iron and Percocet Condition: stable Instructions: refer to practice specific booklet Discharge to: home Follow-up Information    Follow up with Genia Del, MD. (Staple removal on 05/05/12 @ 2:30pm)    Contact information:   [address] [place] [address] [phone]          Newborn Data: Live born female on 04/30/12 Birth Weight: 8 lb 15.9 oz (4080 g) APGAR: 8, 9  Home with mother.  Armoni Depass K 05/02/2012, 10:12 AM

## 2012-05-02 NOTE — Progress Notes (Signed)
Patient ID: Kimberly Nelson, female   DOB: [birth year], 36 y.o.   MRN: [id number] POD # 2  Subjective: Pt reports feeling well and desires early d/c home/ Pain controlled with motrin and percocet Tolerating po/Voiding without problems/ No n/v/Flatus pos Activity: out of bed and ambulate Bleeding is light Newborn info:  Information for the patient's newborn:  Lealer, Marsland [[id number]]  female  / circ complete/ Feeding: breast   Objective: VS: Blood pressure 112/75, pulse 61, temperature 97.3 F (36.3 C), temperature source Oral, resp. rate 18.   LABS:  Basename 05/01/12 0515 04/29/12 1105  WBC 18.9* 11.6*  HGB 9.3* 12.4  HCT 27.3* 36.9  PLT 154 153     Physical Exam:  General: alert, cooperative and no distress CV: Regular rate and rhythm Resp: clear Abdomen: soft, nontender, normal bowel sounds Incision: healing well, well approximated, closed with staples Uterine Fundus: firm, below umbilicus, nontender Lochia: minimal Ext: edema trace to +1 and Homans sign is negative, no sign of DVT    A/P: POD # 2/ G2P1011/ S/P C/Section d/t failure to progress Mild ABL anemia Doing well and stable for early discharge home Will remove staples in office on 05/05/12 @ 230pm RX's: Ibuprofen 600mg  po Q 6 hrs prn pain #30 Refill x 1 Percocet 5/325 1 - 2 tabs po every 6 hrs prn pain  #30 No refill Niferex 150mg  po QD/BID #30/#60 Refill x 1 Colace 100mg  BID -TID prn #60 ref x 1 Rt pp visit in 6 weeks    Signed: Demetrius Revel, MSN, [facility] 05/02/2012, 10:03 AM

## 2012-11-20 ENCOUNTER — Encounter (HOSPITAL_COMMUNITY): Payer: Self-pay | Admitting: *Deleted

## 2012-11-20 ENCOUNTER — Emergency Department (HOSPITAL_COMMUNITY)
Admission: EM | Admit: 2012-11-20 | Discharge: 2012-11-20 | Disposition: A | Discharge status: Home / Self Care | Payer: PRIVATE HEALTH INSURANCE | Attending: Emergency Medicine | Admitting: Emergency Medicine

## 2012-11-20 DIAGNOSIS — Z87448 Personal history of other diseases of urinary system: Secondary | ICD-10-CM | POA: Insufficient documentation

## 2012-11-20 DIAGNOSIS — Z87891 Personal history of nicotine dependence: Secondary | ICD-10-CM | POA: Insufficient documentation

## 2012-11-20 DIAGNOSIS — E860 Dehydration: Secondary | ICD-10-CM | POA: Insufficient documentation

## 2012-11-20 DIAGNOSIS — Z79899 Other long term (current) drug therapy: Secondary | ICD-10-CM | POA: Insufficient documentation

## 2012-11-20 DIAGNOSIS — R112 Nausea with vomiting, unspecified: Secondary | ICD-10-CM | POA: Insufficient documentation

## 2012-11-20 DIAGNOSIS — F411 Generalized anxiety disorder: Secondary | ICD-10-CM | POA: Insufficient documentation

## 2012-11-20 DIAGNOSIS — Z3189 Encounter for other procreative management: Secondary | ICD-10-CM | POA: Insufficient documentation

## 2012-11-20 DIAGNOSIS — R1084 Generalized abdominal pain: Secondary | ICD-10-CM | POA: Insufficient documentation

## 2012-11-20 DIAGNOSIS — K5289 Other specified noninfective gastroenteritis and colitis: Secondary | ICD-10-CM | POA: Insufficient documentation

## 2012-11-20 DIAGNOSIS — K529 Noninfective gastroenteritis and colitis, unspecified: Secondary | ICD-10-CM

## 2012-11-20 DIAGNOSIS — R000 Tachycardia, unspecified: Secondary | ICD-10-CM | POA: Insufficient documentation

## 2012-11-20 DIAGNOSIS — F3289 Other specified depressive episodes: Secondary | ICD-10-CM | POA: Insufficient documentation

## 2012-11-20 DIAGNOSIS — E119 Type 2 diabetes mellitus without complications: Secondary | ICD-10-CM | POA: Insufficient documentation

## 2012-11-20 DIAGNOSIS — Z8719 Personal history of other diseases of the digestive system: Secondary | ICD-10-CM | POA: Insufficient documentation

## 2012-11-20 DIAGNOSIS — Z8632 Personal history of gestational diabetes: Secondary | ICD-10-CM | POA: Insufficient documentation

## 2012-11-20 DIAGNOSIS — F329 Major depressive disorder, single episode, unspecified: Secondary | ICD-10-CM | POA: Insufficient documentation

## 2012-11-20 DIAGNOSIS — Z3202 Encounter for pregnancy test, result negative: Secondary | ICD-10-CM | POA: Insufficient documentation

## 2012-11-20 LAB — COMPREHENSIVE METABOLIC PANEL
AST: 21 U/L (ref 0–37)
CO2: 25 mEq/L (ref 19–32)
Calcium: 9.1 mg/dL (ref 8.4–10.5)
Creatinine, Ser: 0.76 mg/dL (ref 0.50–1.10)
GFR calc non Af Amer: >90 mL/min (ref >90)

## 2012-11-20 LAB — URINALYSIS, MICROSCOPIC ONLY
Nitrite: NEGATIVE
Protein, ur: 30 mg/dL — AB
Urobilinogen, UA: 0.2 mg/dL (ref 0.0–1.0)

## 2012-11-20 LAB — CBC WITH DIFFERENTIAL/PLATELET
Basophils Absolute: 0 10*3/uL (ref 0.0–0.1)
Basophils Relative: 0 % (ref 0–1)
Eosinophils Relative: 1 % (ref 0–5)
HCT: 44.1 % (ref 36.0–46.0)
Lymphocytes Relative: 4 % — ABNORMAL LOW (ref 12–46)
MCHC: 34.5 g/dL (ref 30.0–36.0)
MCV: 89.6 fL (ref 78.0–100.0)
Monocytes Absolute: 0.3 10*3/uL (ref 0.1–1.0)
RDW: 12.5 % (ref 11.5–15.5)

## 2012-11-20 MED ORDER — KETOROLAC TROMETHAMINE 30 MG/ML IJ SOLN
30.0000 mg | Freq: Once | INTRAMUSCULAR | Status: AC
  Administered 2012-11-20: 30 mg via INTRAVENOUS
  Filled 2012-11-20: qty 1

## 2012-11-20 MED ORDER — SODIUM CHLORIDE 0.9 % IV BOLUS (SEPSIS)
1000.0000 mL | Freq: Once | INTRAVENOUS | Status: AC
  Administered 2012-11-20: 1000 mL via INTRAVENOUS

## 2012-11-20 MED ORDER — ONDANSETRON HCL 4 MG/2ML IJ SOLN
4.0000 mg | Freq: Once | INTRAMUSCULAR | Status: AC
  Administered 2012-11-20: 4 mg via INTRAVENOUS
  Filled 2012-11-20: qty 2

## 2012-11-20 MED ORDER — NITROFURANTOIN MONOHYD MACRO 100 MG PO CAPS: 100.0000 mg | ORAL_CAPSULE | Freq: Two times a day (BID) | ORAL | Status: DC

## 2012-11-20 MED ORDER — ONDANSETRON HCL 8 MG PO TABS: 8.0000 mg | ORAL_TABLET | Freq: Three times a day (TID) | ORAL | Status: DC | PRN

## 2012-11-20 NOTE — ED Notes (Signed)
Pt reports n/v/d, body aches since 3pm. Was driving to PCP, became lightheaded and pulled over to call ems. Received 4mg  zofran IV en route. 20g IV L AC.

## 2012-11-20 NOTE — ED Notes (Signed)
PA at bedside.

## 2012-11-20 NOTE — ED Provider Notes (Signed)
History     CSN: [id number]  Arrival date & time 11/20/12  4098   First MD Initiated Contact with Patient 11/20/12 2011      Chief Complaint  Patient presents with  . Nausea  . Emesis  . Diarrhea  . Flu Like Symptoms     (Consider location/radiation/quality/duration/timing/severity/associated sxs/prior treatment) HPI History provided by pt.   Pt developed N/V/D at 2:30pm today.  Vomited 10+ times.  Associated w/ diffuse, crampy abdominal pain.  While driving herself to the doctor, she became lightheaded, SOB,  Tremulous and had sensation of heart racing.  Thought she was going to pass out so pulled over and called EMS.  Has h/o anxiety and that may have contributed to these sx.  Denies fever, CP, hematemesis/hematochezia/melena and GU sx.  Has had a c-section but otherwise no abdominal surgeries and no PMH.  59mo son recently had vomiting and diarrhea. Past Medical History  Diagnosis Date  . Diabetes mellitus   . Gestational diabetes     diet controlled  . Urinary tract infection   . Anxiety     on meds, doing well  . Abnormal Pap smear     colpo, ok since  . Depression   . ASCUS (atypical squamous cells of undetermined significance) on Pap smear   . Laryngopharyngeal reflux   . Infertility associated with anovulation   . Artificial insemination   . Postpartum care following cesarean delivery (8/14, FTD) 04/30/2012    Past Surgical History  Procedure Laterality Date  . Induced abortion    . Cesarean section  04/30/2012    Procedure: CESAREAN SECTION;  Surgeon: Lenoard Aden, MD;  Location: [facility];  Service: Obstetrics;  Laterality: N/A;    Family History  Problem Relation Age of Onset  . Cancer Other   . Hypertension Other   . Hyperlipidemia Other   . Other Neg Hx     History  Substance Use Topics  . Smoking status: Former Smoker -- 0.50 packs/day    Types: Cigarettes    Quit date: 04/29/2000  . Smokeless tobacco: Never Used  . Alcohol Use: Yes     Comment:  occasionally    OB History   Grav Para Term Preterm Abortions TAB SAB Ect Mult Living   2 1 1  0 1 1 0 0 0 1      Review of Systems  All other systems reviewed and are negative.    Allergies  Penicillins and Sulfa antibiotics  Home Medications   Current Outpatient Rx  Name  Route  Sig  Dispense  Refill  . Prenatal Vit-Fe Fumarate-FA (MULTIVITAMIN-PRENATAL) 27-0.8 MG TABS   Oral   Take 1 tablet by mouth daily.         . promethazine (PHENERGAN) 25 MG tablet   Oral   Take 25 mg by mouth every 6 (six) hours as needed for nausea.         . sertraline (ZOLOFT) 50 MG tablet   Oral   Take 75 mg by mouth daily.           BP 130/86  Pulse 105  Temp(Src) 97.5 F (36.4 C) (Oral)  Resp 18  SpO2 100%  LMP 11/15/2012  Physical Exam  Nursing note and vitals reviewed. Constitutional: She is oriented to person, place, and time. She appears well-developed and well-nourished. No distress.  HENT:  Head: Normocephalic and atraumatic.  Mouth/Throat: Oropharynx is clear and moist.  Eyes:  Normal appearance  Neck: Normal range  of motion.  Cardiovascular: Regular rhythm.   Mild tachycardia  Pulmonary/Chest: Effort normal and breath sounds normal. No respiratory distress.  Abdominal: Soft. Bowel sounds are normal. She exhibits no distension and no mass. There is no rebound and no guarding.  Mild, diffuse ttp  Genitourinary:  No CVA tenderness  Musculoskeletal: Normal range of motion.  Neurological: She is alert and oriented to person, place, and time.  Skin: Skin is warm and dry. No rash noted.  Psychiatric: She has a normal mood and affect. Her behavior is normal.    ED Course  Procedures (including critical care time)  Labs Reviewed  URINALYSIS, MICROSCOPIC ONLY - Abnormal; Notable for the following:    APPearance CLOUDY (*)    pH 8.5 (*)    Ketones, ur 15 (*)    Protein, ur 30 (*)    Leukocytes, UA MODERATE (*)    Bacteria, UA MANY (*)    Squamous Epithelial  / LPF MANY (*)    All other components within normal limits  CBC WITH DIFFERENTIAL - Abnormal; Notable for the following:    WBC 11.5 (*)    Hemoglobin 15.2 (*)    Neutrophils Relative 93 (*)    Neutro Abs 10.7 (*)    Lymphocytes Relative 4 (*)    Lymphs Abs 0.4 (*)    All other components within normal limits  URINE CULTURE  COMPREHENSIVE METABOLIC PANEL  CBC WITH DIFFERENTIAL  POCT PREGNANCY, URINE   No results found.   1. Gastroenteritis   2. Dehydration       MDM  Healthy 37yo F w/ h/o anxiety, presents w/ N/V/D since this afternoon, as well as episode of lightheadedness, SOB, palpitations and tremulousness just prior to arrival, which may be secondary to anxiety.  On exam, non-toxic appearance, afebrile, mildly tachycardic, abd mildly, diffusely tender but soft and non-distended.  Suspect viral etiology of GI sx and that lightheadedness secondary to dehydration, with anxiety another contributing factor.  Pt receiving IVF, zofran and toradol.  Will reassess shortly.  8:52 PM   Labs sig for UTI, likely incidental, and ketonuria.  Pt reports feeling better and abd benign on repeat exam.  She is tolerating pos.  Will d/c home w/ zofran as well as macrobid.  Recommends fluids and rest. Return precautions discussed.     Otilio Miu, PA-C 11/21/12 [phone]

## 2012-11-20 NOTE — ED Notes (Signed)
JYN:[id number]> Expected date:<BR> Expected time:<BR> Means of arrival:<BR> Comments:<BR> vomiting

## 2012-11-22 LAB — URINE CULTURE

## 2012-11-23 NOTE — ED Provider Notes (Signed)
Medical screening examination/treatment/procedure(s) were performed by non-physician practitioner and as supervising physician I was immediately available for consultation/collaboration.  Ankit Nanavati, MD 11/23/12 0710 

## 2013-01-15 DIAGNOSIS — O021 Missed abortion: Secondary | ICD-10-CM

## 2013-01-15 HISTORY — DX: Missed abortion: O02.1

## 2013-07-23 ENCOUNTER — Other Ambulatory Visit: Payer: Self-pay

## 2013-12-03 LAB — OB RESULTS CONSOLE HIV ANTIBODY (ROUTINE TESTING)
HIV: NONREACTIVE
HIV: NONREACTIVE

## 2013-12-03 LAB — OB RESULTS CONSOLE HEPATITIS B SURFACE ANTIGEN: HEP B S AG: NEGATIVE

## 2013-12-03 LAB — OB RESULTS CONSOLE ANTIBODY SCREEN: ANTIBODY SCREEN: NEGATIVE

## 2013-12-03 LAB — OB RESULTS CONSOLE RUBELLA ANTIBODY, IGM: RUBELLA: IMMUNE

## 2013-12-03 LAB — OB RESULTS CONSOLE ABO/RH: RH TYPE: POSITIVE

## 2013-12-03 LAB — OB RESULTS CONSOLE RPR
RPR: NONREACTIVE
RPR: NONREACTIVE

## 2014-06-23 ENCOUNTER — Encounter (HOSPITAL_COMMUNITY): Payer: Self-pay | Admitting: Pharmacist

## 2014-06-23 ENCOUNTER — Other Ambulatory Visit: Payer: Self-pay | Admitting: Obstetrics & Gynecology

## 2014-06-29 NOTE — Patient Instructions (Addendum)
   Your procedure is scheduled on:  Monday, Oct 19  Enter through the Main [facility]tr[facility] of [facility] at: 7:45 AM Pick up the phone at the desk and dial [phone] and inform us of your arrival.  Please call this number if you have any problems the morning of surgery: [phone]  Remember: Do not eat or drink after midnight: Sunday Take these medicines the morning of surgery with a SIP OF WATER:  None  Do not wear jewelry, make-up, or FINGER nail polish No metal in your hair or on your body. Do not wear lotions, powders, perfumes.  You may wear deodorant.  Do not bring valuables to the hospital. Contacts, dentures or bridgework may not be worn into surgery.  Leave suitcase in the car. After Surgery it may be brought to your room. For patients being admitted to the hospital, checkout time is 11:00am the day of discharge.  Home with husband Reuel BoomDaniel cell [phone]

## 2014-07-02 ENCOUNTER — Encounter (HOSPITAL_COMMUNITY)
Admission: RE | Admit: 2014-07-02 | Discharge: 2014-07-02 | Disposition: A | Discharge status: Home / Self Care | Payer: 59 | Source: Ambulatory Visit | Attending: Obstetrics & Gynecology | Admitting: Obstetrics & Gynecology

## 2014-07-02 ENCOUNTER — Encounter (HOSPITAL_COMMUNITY): Payer: Self-pay

## 2014-07-02 HISTORY — DX: Missed abortion: O02.1

## 2014-07-02 HISTORY — DX: Encounter for elective termination of pregnancy: Z33.2

## 2014-07-02 HISTORY — DX: Heartburn: R12

## 2014-07-02 HISTORY — DX: Other seasonal allergic rhinitis: J30.2

## 2014-07-02 HISTORY — DX: Other specified pregnancy related conditions, unspecified trimester: O26.899

## 2014-07-02 HISTORY — DX: Unspecified hemorrhoids: K64.9

## 2014-07-02 LAB — CBC
HCT: 33.8 % — ABNORMAL LOW (ref 36.0–46.0)
Hemoglobin: 11.6 g/dL — ABNORMAL LOW (ref 12.0–15.0)
MCH: 32 pg (ref 26.0–34.0)
MCHC: 34.3 g/dL (ref 30.0–36.0)
MCV: 93.1 fL (ref 78.0–100.0)
PLATELETS: 206 10*3/uL (ref 150–400)
RBC: 3.63 MIL/uL — AB (ref 3.87–5.11)
RDW: 15.8 % — AB (ref 11.5–15.5)
WBC: 11 10*3/uL — ABNORMAL HIGH (ref 4.0–10.5)

## 2014-07-02 LAB — TYPE AND SCREEN
ABO/RH(D): A POS
Antibody Screen: NEGATIVE

## 2014-07-02 LAB — RPR

## 2014-07-04 MED ORDER — GENTAMICIN SULFATE 40 MG/ML IJ SOLN
INTRAVENOUS | Status: AC
  Administered 2014-07-05: 113.25 mL via INTRAVENOUS
  Filled 2014-07-04: qty 7.25

## 2014-07-05 ENCOUNTER — Inpatient Hospital Stay (HOSPITAL_COMMUNITY): Payer: 59 | Admitting: Anesthesiology

## 2014-07-05 ENCOUNTER — Encounter (HOSPITAL_COMMUNITY): Payer: Self-pay | Admitting: Anesthesiology

## 2014-07-05 ENCOUNTER — Encounter (HOSPITAL_COMMUNITY)
Admission: RE | Disposition: A | Discharge status: Home / Self Care | Payer: Self-pay | Source: Ambulatory Visit | Attending: Obstetrics & Gynecology

## 2014-07-05 ENCOUNTER — Encounter (HOSPITAL_COMMUNITY): Payer: 59 | Admitting: Anesthesiology

## 2014-07-05 ENCOUNTER — Inpatient Hospital Stay (HOSPITAL_COMMUNITY)
Admission: RE | Admit: 2014-07-05 | Discharge: 2014-07-07 | DRG: 765 | Disposition: A | Discharge status: Home / Self Care | Payer: 59 | Source: Ambulatory Visit | Attending: Obstetrics & Gynecology | Admitting: Obstetrics & Gynecology

## 2014-07-05 DIAGNOSIS — Z87891 Personal history of nicotine dependence: Secondary | ICD-10-CM | POA: Diagnosis not present

## 2014-07-05 DIAGNOSIS — O9902 Anemia complicating childbirth: Secondary | ICD-10-CM | POA: Diagnosis present

## 2014-07-05 DIAGNOSIS — O09523 Supervision of elderly multigravida, third trimester: Secondary | ICD-10-CM | POA: Diagnosis not present

## 2014-07-05 DIAGNOSIS — Z8249 Family history of ischemic heart disease and other diseases of the circulatory system: Secondary | ICD-10-CM

## 2014-07-05 DIAGNOSIS — F329 Major depressive disorder, single episode, unspecified: Secondary | ICD-10-CM | POA: Diagnosis present

## 2014-07-05 DIAGNOSIS — Z9889 Other specified postprocedural states: Secondary | ICD-10-CM

## 2014-07-05 DIAGNOSIS — D62 Acute posthemorrhagic anemia: Secondary | ICD-10-CM | POA: Diagnosis present

## 2014-07-05 DIAGNOSIS — O3421 Maternal care for scar from previous cesarean delivery: Principal | ICD-10-CM | POA: Diagnosis present

## 2014-07-05 DIAGNOSIS — Z3A39 39 weeks gestation of pregnancy: Secondary | ICD-10-CM | POA: Diagnosis present

## 2014-07-05 DIAGNOSIS — O99343 Other mental disorders complicating pregnancy, third trimester: Secondary | ICD-10-CM | POA: Diagnosis present

## 2014-07-05 DIAGNOSIS — F418 Other specified anxiety disorders: Secondary | ICD-10-CM | POA: Diagnosis present

## 2014-07-05 DIAGNOSIS — O3663X0 Maternal care for excessive fetal growth, third trimester, not applicable or unspecified: Secondary | ICD-10-CM | POA: Diagnosis present

## 2014-07-05 DIAGNOSIS — F419 Anxiety disorder, unspecified: Secondary | ICD-10-CM

## 2014-07-05 DIAGNOSIS — F32A Depression, unspecified: Secondary | ICD-10-CM | POA: Diagnosis present

## 2014-07-05 LAB — BASIC METABOLIC PANEL
ANION GAP: 13 (ref 5–15)
BUN: 10 mg/dL (ref 6–23)
CHLORIDE: 101 meq/L (ref 96–112)
CO2: 21 meq/L (ref 19–32)
CREATININE: 0.59 mg/dL (ref 0.50–1.10)
Calcium: 8.7 mg/dL (ref 8.4–10.5)
GFR calc Af Amer: >90 mL/min (ref >90)
GFR calc non Af Amer: >90 mL/min (ref >90)
GLUCOSE: 82 mg/dL (ref 70–99)
Potassium: 3.9 mEq/L (ref 3.7–5.3)
Sodium: 135 mEq/L — ABNORMAL LOW (ref 137–147)

## 2014-07-05 SURGERY — Surgical Case
Anesthesia: Spinal | Site: Abdomen

## 2014-07-05 MED ORDER — FENTANYL CITRATE 0.05 MG/ML IJ SOLN
INTRAMUSCULAR | Status: DC | PRN
  Administered 2014-07-05: 25 ug via INTRATHECAL

## 2014-07-05 MED ORDER — LACTATED RINGERS IV SOLN
INTRAVENOUS | Status: DC
  Administered 2014-07-05: 1 mL via INTRAVENOUS

## 2014-07-05 MED ORDER — BUPIVACAINE HCL (PF) 0.25 % IJ SOLN
INTRAMUSCULAR | Status: DC | PRN
  Administered 2014-07-05: 20 mL

## 2014-07-05 MED ORDER — SODIUM CHLORIDE 0.9 % IJ SOLN: 3.0000 mL | INTRAMUSCULAR | Status: DC | PRN

## 2014-07-05 MED ORDER — OXYCODONE-ACETAMINOPHEN 5-325 MG PO TABS
2.0000 | ORAL_TABLET | ORAL | Status: DC | PRN
  Administered 2014-07-06 (×3): 2 via ORAL
  Filled 2014-07-05 (×4): qty 2

## 2014-07-05 MED ORDER — KETOROLAC TROMETHAMINE 30 MG/ML IJ SOLN
30.0000 mg | Freq: Four times a day (QID) | INTRAMUSCULAR | Status: DC | PRN
  Administered 2014-07-05: 30 mg via INTRAMUSCULAR

## 2014-07-05 MED ORDER — PHENYLEPHRINE HCL 10 MG/ML IJ SOLN: INTRAMUSCULAR | Status: DC | PRN

## 2014-07-05 MED ORDER — OXYCODONE-ACETAMINOPHEN 5-325 MG PO TABS
1.0000 | ORAL_TABLET | ORAL | Status: DC | PRN
  Administered 2014-07-05 – 2014-07-07 (×2): 1 via ORAL
  Filled 2014-07-05 (×2): qty 1

## 2014-07-05 MED ORDER — MORPHINE SULFATE 0.5 MG/ML IJ SOLN
INTRAMUSCULAR | Status: AC
  Filled 2014-07-05: qty 10

## 2014-07-05 MED ORDER — OXYTOCIN 40 UNITS IN LACTATED RINGERS INFUSION - SIMPLE MED: 62.5000 mL/h | INTRAVENOUS | Status: AC

## 2014-07-05 MED ORDER — ONDANSETRON HCL 4 MG/2ML IJ SOLN: 4.0000 mg | INTRAMUSCULAR | Status: DC | PRN

## 2014-07-05 MED ORDER — NALBUPHINE HCL 10 MG/ML IJ SOLN: 5.0000 mg | Freq: Once | INTRAMUSCULAR | Status: AC | PRN

## 2014-07-05 MED ORDER — KETOROLAC TROMETHAMINE 30 MG/ML IJ SOLN: 15.0000 mg | Freq: Once | INTRAMUSCULAR | Status: DC | PRN

## 2014-07-05 MED ORDER — LACTATED RINGERS IV SOLN
INTRAVENOUS | Status: DC
  Administered 2014-07-05 (×3): via INTRAVENOUS

## 2014-07-05 MED ORDER — DIPHENHYDRAMINE HCL 25 MG PO CAPS: 25.0000 mg | ORAL_CAPSULE | ORAL | Status: DC | PRN

## 2014-07-05 MED ORDER — MAGNESIUM HYDROXIDE 400 MG/5ML PO SUSP: 30.0000 mL | ORAL | Status: DC | PRN

## 2014-07-05 MED ORDER — ONDANSETRON HCL 4 MG PO TABS: 4.0000 mg | ORAL_TABLET | ORAL | Status: DC | PRN

## 2014-07-05 MED ORDER — BUPIVACAINE HCL (PF) 0.25 % IJ SOLN
INTRAMUSCULAR | Status: AC
  Filled 2014-07-05: qty 30

## 2014-07-05 MED ORDER — BUPIVACAINE IN DEXTROSE 0.75-8.25 % IT SOLN
INTRATHECAL | Status: DC | PRN
  Administered 2014-07-05: 10.5 mg via INTRATHECAL

## 2014-07-05 MED ORDER — DIPHENHYDRAMINE HCL 50 MG/ML IJ SOLN: 12.5000 mg | INTRAMUSCULAR | Status: DC | PRN

## 2014-07-05 MED ORDER — OXYTOCIN 10 UNIT/ML IJ SOLN
INTRAMUSCULAR | Status: AC
  Filled 2014-07-05: qty 4

## 2014-07-05 MED ORDER — SENNOSIDES-DOCUSATE SODIUM 8.6-50 MG PO TABS
2.0000 | ORAL_TABLET | ORAL | Status: DC
  Administered 2014-07-05 – 2014-07-06 (×2): 2 via ORAL
  Filled 2014-07-05 (×2): qty 2

## 2014-07-05 MED ORDER — PRENATAL MULTIVITAMIN CH
1.0000 | ORAL_TABLET | Freq: Every day | ORAL | Status: DC
  Administered 2014-07-06 – 2014-07-07 (×2): 1 via ORAL
  Filled 2014-07-05 (×2): qty 1

## 2014-07-05 MED ORDER — WITCH HAZEL-GLYCERIN EX PADS: 1.0000 "application " | MEDICATED_PAD | CUTANEOUS | Status: DC | PRN

## 2014-07-05 MED ORDER — NALOXONE HCL 0.4 MG/ML IJ SOLN: 0.4000 mg | INTRAMUSCULAR | Status: DC | PRN

## 2014-07-05 MED ORDER — PROMETHAZINE HCL 25 MG/ML IJ SOLN: 6.2500 mg | INTRAMUSCULAR | Status: DC | PRN

## 2014-07-05 MED ORDER — DIBUCAINE 1 % RE OINT: 1.0000 "application " | TOPICAL_OINTMENT | RECTAL | Status: DC | PRN

## 2014-07-05 MED ORDER — SIMETHICONE 80 MG PO CHEW: 80.0000 mg | CHEWABLE_TABLET | ORAL | Status: DC | PRN

## 2014-07-05 MED ORDER — ZOLPIDEM TARTRATE 5 MG PO TABS: 5.0000 mg | ORAL_TABLET | Freq: Every evening | ORAL | Status: DC | PRN

## 2014-07-05 MED ORDER — FENTANYL CITRATE 0.05 MG/ML IJ SOLN
INTRAMUSCULAR | Status: AC
  Filled 2014-07-05: qty 2

## 2014-07-05 MED ORDER — LORATADINE 10 MG PO TABS
10.0000 mg | ORAL_TABLET | Freq: Every day | ORAL | Status: DC
  Administered 2014-07-05 – 2014-07-06 (×2): 10 mg via ORAL
  Filled 2014-07-05 (×3): qty 1

## 2014-07-05 MED ORDER — ONDANSETRON HCL 4 MG/2ML IJ SOLN
INTRAMUSCULAR | Status: AC
  Filled 2014-07-05: qty 2

## 2014-07-05 MED ORDER — OXYTOCIN 10 UNIT/ML IJ SOLN
40.0000 [IU] | INTRAVENOUS | Status: DC | PRN
  Administered 2014-07-05: 40 [IU] via INTRAVENOUS

## 2014-07-05 MED ORDER — FERROUS SULFATE 325 (65 FE) MG PO TABS
325.0000 mg | ORAL_TABLET | Freq: Two times a day (BID) | ORAL | Status: DC
  Administered 2014-07-05 – 2014-07-07 (×4): 325 mg via ORAL
  Filled 2014-07-05 (×4): qty 1

## 2014-07-05 MED ORDER — MEPERIDINE HCL 25 MG/ML IJ SOLN: 6.2500 mg | INTRAMUSCULAR | Status: DC | PRN

## 2014-07-05 MED ORDER — ONDANSETRON HCL 4 MG/2ML IJ SOLN: 4.0000 mg | Freq: Three times a day (TID) | INTRAMUSCULAR | Status: DC | PRN

## 2014-07-05 MED ORDER — DEXAMETHASONE SODIUM PHOSPHATE 4 MG/ML IJ SOLN
INTRAMUSCULAR | Status: AC
  Filled 2014-07-05: qty 1

## 2014-07-05 MED ORDER — SIMETHICONE 80 MG PO CHEW
80.0000 mg | CHEWABLE_TABLET | ORAL | Status: DC
  Administered 2014-07-05 – 2014-07-06 (×2): 80 mg via ORAL
  Filled 2014-07-05 (×2): qty 1

## 2014-07-05 MED ORDER — HYDROMORPHONE HCL 1 MG/ML IJ SOLN: 0.2500 mg | INTRAMUSCULAR | Status: DC | PRN

## 2014-07-05 MED ORDER — IBUPROFEN 600 MG PO TABS
600.0000 mg | ORAL_TABLET | Freq: Four times a day (QID) | ORAL | Status: DC
  Administered 2014-07-05 – 2014-07-07 (×7): 600 mg via ORAL
  Filled 2014-07-05 (×7): qty 1

## 2014-07-05 MED ORDER — MEPERIDINE HCL 25 MG/ML IJ SOLN
6.2500 mg | INTRAMUSCULAR | Status: DC | PRN
Start: 2014-07-05 — End: 2014-07-05

## 2014-07-05 MED ORDER — IBUPROFEN 600 MG PO TABS: 600.0000 mg | ORAL_TABLET | Freq: Four times a day (QID) | ORAL | Status: DC | PRN

## 2014-07-05 MED ORDER — NALBUPHINE HCL 10 MG/ML IJ SOLN: 5.0000 mg | INTRAMUSCULAR | Status: DC | PRN

## 2014-07-05 MED ORDER — MENTHOL 3 MG MT LOZG: 1.0000 | LOZENGE | OROMUCOSAL | Status: DC | PRN

## 2014-07-05 MED ORDER — KETOROLAC TROMETHAMINE 30 MG/ML IJ SOLN
INTRAMUSCULAR | Status: AC
  Filled 2014-07-05: qty 1

## 2014-07-05 MED ORDER — ONDANSETRON HCL 4 MG/2ML IJ SOLN
INTRAMUSCULAR | Status: DC | PRN
  Administered 2014-07-05: 4 mg via INTRAVENOUS

## 2014-07-05 MED ORDER — DEXTROSE 5 % IV SOLN
1.0000 ug/kg/h | INTRAVENOUS | Status: DC | PRN
  Filled 2014-07-05: qty 2

## 2014-07-05 MED ORDER — SERTRALINE HCL 50 MG PO TABS
75.0000 mg | ORAL_TABLET | Freq: Every day | ORAL | Status: DC
  Administered 2014-07-05 – 2014-07-06 (×2): 75 mg via ORAL
  Filled 2014-07-05 (×3): qty 1

## 2014-07-05 MED ORDER — PHENYLEPHRINE 8 MG IN D5W 100 ML (0.08MG/ML) PREMIX OPTIME
INJECTION | INTRAVENOUS | Status: DC | PRN
  Administered 2014-07-05: 60 ug/min via INTRAVENOUS

## 2014-07-05 MED ORDER — TETANUS-DIPHTH-ACELL PERTUSSIS 5-2.5-18.5 LF-MCG/0.5 IM SUSP: 0.5000 mL | Freq: Once | INTRAMUSCULAR | Status: DC

## 2014-07-05 MED ORDER — DEXAMETHASONE SODIUM PHOSPHATE 4 MG/ML IJ SOLN
INTRAMUSCULAR | Status: DC | PRN
  Administered 2014-07-05: 4 mg via INTRAVENOUS

## 2014-07-05 MED ORDER — SCOPOLAMINE 1 MG/3DAYS TD PT72
1.0000 | MEDICATED_PATCH | Freq: Once | TRANSDERMAL | Status: DC
  Administered 2014-07-05: 1.5 mg via TRANSDERMAL

## 2014-07-05 MED ORDER — SCOPOLAMINE 1 MG/3DAYS TD PT72
MEDICATED_PATCH | TRANSDERMAL | Status: AC
  Administered 2014-07-05: 1.5 mg via TRANSDERMAL
  Filled 2014-07-05: qty 1

## 2014-07-05 MED ORDER — DIPHENHYDRAMINE HCL 25 MG PO CAPS: 25.0000 mg | ORAL_CAPSULE | Freq: Four times a day (QID) | ORAL | Status: DC | PRN

## 2014-07-05 MED ORDER — KETOROLAC TROMETHAMINE 30 MG/ML IJ SOLN: 30.0000 mg | Freq: Four times a day (QID) | INTRAMUSCULAR | Status: DC | PRN

## 2014-07-05 MED ORDER — SIMETHICONE 80 MG PO CHEW
80.0000 mg | CHEWABLE_TABLET | Freq: Three times a day (TID) | ORAL | Status: DC
  Administered 2014-07-05 – 2014-07-07 (×6): 80 mg via ORAL
  Filled 2014-07-05 (×6): qty 1

## 2014-07-05 MED ORDER — MORPHINE SULFATE (PF) 0.5 MG/ML IJ SOLN
INTRAMUSCULAR | Status: DC | PRN
  Administered 2014-07-05: .15 mg via INTRATHECAL

## 2014-07-05 MED ORDER — LANOLIN HYDROUS EX OINT: 1.0000 "application " | TOPICAL_OINTMENT | CUTANEOUS | Status: DC | PRN

## 2014-07-05 SURGICAL SUPPLY — 39 items
CLAMP CORD UMBIL (MISCELLANEOUS) IMPLANT
CLOTH BEACON ORANGE TIMEOUT ST (SAFETY) ×2 IMPLANT
CONTAINER PREFILL 10% NBF 15ML (MISCELLANEOUS) IMPLANT
DERMABOND ADVANCED (GAUZE/BANDAGES/DRESSINGS) ×1
DERMABOND ADVANCED .7 DNX12 (GAUZE/BANDAGES/DRESSINGS) ×1 IMPLANT
DRAPE SHEET LG 3/4 BI-LAMINATE (DRAPES) IMPLANT
DRSG OPSITE POSTOP 4X10 (GAUZE/BANDAGES/DRESSINGS) ×2 IMPLANT
DURAPREP 26ML APPLICATOR (WOUND CARE) ×2 IMPLANT
ELECT REM PT RETURN 9FT ADLT (ELECTROSURGICAL) ×2
ELECTRODE REM PT RTRN 9FT ADLT (ELECTROSURGICAL) ×1 IMPLANT
EXTRACTOR VACUUM M CUP 4 TUBE (SUCTIONS) IMPLANT
GLOVE BIO SURGEON STRL SZ 6.5 (GLOVE) ×2 IMPLANT
GLOVE BIOGEL PI IND STRL 7.0 (GLOVE) ×1 IMPLANT
GLOVE BIOGEL PI INDICATOR 7.0 (GLOVE) ×1
GOWN STRL REUS W/TWL LRG LVL3 (GOWN DISPOSABLE) ×4 IMPLANT
KIT ABG SYR 3ML LUER SLIP (SYRINGE) IMPLANT
NEEDLE HYPO 22GX1.5 SAFETY (NEEDLE) ×2 IMPLANT
NEEDLE HYPO 25X5/8 SAFETYGLIDE (NEEDLE) IMPLANT
PACK C SECTION WH (CUSTOM PROCEDURE TRAY) ×2 IMPLANT
PAD OB MATERNITY 4.3X12.25 (PERSONAL CARE ITEMS) ×2 IMPLANT
RTRCTR C-SECT PINK 25CM LRG (MISCELLANEOUS) ×2 IMPLANT
STAPLER VISISTAT 35W (STAPLE) IMPLANT
SUT MNCRL AB 3-0 PS2 27 (SUTURE) ×2 IMPLANT
SUT PLAIN 0 NONE (SUTURE) IMPLANT
SUT PLAIN 2 0 (SUTURE) ×1
SUT PLAIN ABS 2-0 CT1 27XMFL (SUTURE) ×1 IMPLANT
SUT VIC AB 0 CT1 27 (SUTURE) ×2
SUT VIC AB 0 CT1 27XBRD ANBCTR (SUTURE) ×2 IMPLANT
SUT VIC AB 0 CTX 36 (SUTURE) ×2
SUT VIC AB 0 CTX36XBRD ANBCTRL (SUTURE) ×2 IMPLANT
SUT VIC AB 2-0 CT1 27 (SUTURE) ×1
SUT VIC AB 2-0 CT1 TAPERPNT 27 (SUTURE) ×1 IMPLANT
SUT VIC AB 3-0 SH 27 (SUTURE)
SUT VIC AB 3-0 SH 27X BRD (SUTURE) IMPLANT
SUT VIC AB 4-0 PS2 27 (SUTURE) ×2 IMPLANT
SYR CONTROL 10ML LL (SYRINGE) ×2 IMPLANT
TOWEL OR 17X24 6PK STRL BLUE (TOWEL DISPOSABLE) ×2 IMPLANT
TRAY FOLEY CATH 14FR (SET/KITS/TRAYS/PACK) ×2 IMPLANT
WATER STERILE IRR 1000ML POUR (IV SOLUTION) IMPLANT

## 2014-07-05 NOTE — Op Note (Signed)
Preoperative diagnosis: Intrauterine pregnancy at 39 weeks and 3 days                                            Previous C/S                                            Suspicion of macrosomia   Post operative diagnosis: Same  Anesthesia: Spinal  Anesthesiologist: Dr. Arby BarretteHatchett  Procedure: Repeat elective low transverse cesarean section  Surgeon: Dr. Genia DelMarie-Lyne Nghia Mcentee  Assistant: Denton Meekolita Dawson   Estimated blood loss: 700 cc  Procedure:  After being informed of the planned procedure and possible complications including bleeding, infection, injury to other organs, informed consent is obtained. The patient is taken to OR #9 and given spinal anesthesia without complication. She is placed in the dorsal decubitus position with the pelvis tilted to the left. She is then prepped and draped in a sterile fashion. A Foley catheter is inserted in her bladder.  After assessing adequate level of anesthesia, we infiltrate the suprapubic area with 20 cc of Marcaine 0.25 and perform a Pfannenstiel incision which is brought down sharply to the fascia. The fascia is entered in a low transverse fashion. Linea alba is dissected. Peritoneum is entered in a midline fashion. An Alexis retractor is easily positioned. Visceral peritoneum is entered in a low transverse fashion allowing us to safely retract bladder by developing a bladder flap.  The myometrium is then entered in a low transverse fashion; first with knife and then extended bluntly. Amniotic fluid is tinted meconium.  1 loose nuchal cord is present. We assist the birth of a female  infant in cephalic presentation. Mouth and nose are suctioned. The baby is delivered. The cord is clamped and sectioned. The baby is given to the neonatologist present in the room.  10 cc of blood is drawn from the umbilical vein.The placenta is allowed to deliver spontaneously. It is complete and the cord has 3 vessels. Uterine revision is negative.  We proceed with closure  of the myometrium in 2 layers: First with a running locked suture of 0 Vicryl, then with a Lembert suture of 0 Vicryl imbricating the first one. Hemostasis is completed with cauterization on peritoneal edges.  Both paracolic gutters are cleaned. Both tubes and ovaries are assessed and normal. We confirm a satisfactory hemostasis.  Retractors and sponges are removed. Under fascia hemostasis is completed with cauterization.  The parietal peritoneum is closed with a running suture of Vicryl 2-0.  The fascia is then closed with 2 running sutures of 0 Vicryl meeting in midline.  Hemostasis is completed with cauterization on the adipose tissue.  The adipose tissue is reapproximated with a running suture of Plain 2-0. The skin is closed with a subcuticular suture of 3-0 Monocryl and Dermabond.  A Honeycomb dressing is added.  Instrument and sponge count is complete x2. Estimated blood loss is 700 cc.  The procedure is well tolerated by the patient who is taken to recovery room in a well and stable condition.  female baby named Joelene MillinOliver was born at 9:57 am and received an Apgar of 9  at 1 minute and 9 at 5 minutes. Weight was 8+ lbs.    Specimen: Placenta sent to [facility]   Kristina Bertone,[name] MD 10/19/201510:38 AM

## 2014-07-05 NOTE — Anesthesia Postprocedure Evaluation (Signed)
  Anesthesia Post-op Note  Patient: Kimberly Nelson  Procedure(s) Performed: Procedure(s): Repeat CESAREAN SECTION (N/A)  Patient Location: PACU and Mother/Baby  Anesthesia Type:Spinal  Level of Consciousness: awake, alert , oriented and patient cooperative  Airway and Oxygen Therapy: Patient Spontanous Breathing  Post-op Pain: none  Post-op Assessment: Post-op Vital signs reviewed, Patient's Cardiovascular Status Stable, Respiratory Function Stable, Patent Airway, No signs of Nausea or vomiting, Adequate PO intake, Pain level controlled, No headache, No backache, No residual numbness and No residual motor weakness  Post-op Vital Signs: Reviewed and stable  Last Vitals:  Filed Vitals:   07/05/14 1636  BP: 107/60  Pulse: 73  Temp:   Resp:     Complications: No apparent anesthesia complications

## 2014-07-05 NOTE — Addendum Note (Signed)
Addendum created 07/05/14 1817 by Orlie Pollenebra R Aj Crunkleton, CRNA   Modules edited: Notes Section   Notes Section:  File: [id number]

## 2014-07-05 NOTE — Anesthesia Preprocedure Evaluation (Signed)
Anesthesia Evaluation  Patient identified by MRN, date of birth, ID band Patient awake    Reviewed: Allergy & Precautions, H&P , NPO status , Patient's Chart, lab work & pertinent test results  Airway Mallampati: I TM Distance: >3 FB Neck ROM: full    Dental   Pulmonary neg pulmonary ROS, former smoker,    Pulmonary exam normal       Cardiovascular negative cardio ROS      Neuro/Psych negative neurological ROS     GI/Hepatic negative GI ROS, Neg liver ROS,   Endo/Other  negative endocrine ROS  Renal/GU negative Renal ROS     Musculoskeletal   Abdominal Normal abdominal exam  (+)   Peds  Hematology negative hematology ROS (+)   Anesthesia Other Findings   Reproductive/Obstetrics (+) Pregnancy                           Anesthesia Physical Anesthesia Plan  ASA: II  Anesthesia Plan: Spinal   Post-op Pain Management:    Induction:   Airway Management Planned:   Additional Equipment:   Intra-op Plan:   Post-operative Plan:   Informed Consent: I have reviewed the patients History and Physical, chart, labs and discussed the procedure including the risks, benefits and alternatives for the proposed anesthesia with the patient or authorized representative who has indicated his/her understanding and acceptance.     Plan Discussed with: CRNA and Surgeon  Anesthesia Plan Comments:         Anesthesia Quick Evaluation

## 2014-07-05 NOTE — H&P (Signed)
Kimberly Nelson is a 38 y.o. female [id number] presenting for repeat C/S.  OB History   Grav Para Term Preterm Abortions TAB SAB Ect Mult Living   5 1 1  0 2 1 1  0 0 1     Past Medical History  Diagnosis Date  . Urinary tract infection     Hx - resolved  . Anxiety     on meds, doing well  . Abnormal Pap smear     colpo, ok since  . Depression   . ASCUS (atypical squamous cells of undetermined significance) on Pap smear   . Laryngopharyngeal reflux   . Infertility associated with anovulation   . Artificial insemination     Hx only  . Postpartum care following cesarean delivery (8/14, FTD) 04/30/2012    resolved  . Missed ab 01/2013    6 wks  . Termination of pregnancy (fetus)     10 wks  . Seasonal allergies   . Hemorrhoids   . Heartburn in pregnancy    Past Surgical History  Procedure Laterality Date  . Induced abortion    . Cesarean section  04/30/2012    Procedure: CESAREAN SECTION;  Surgeon: Lenoard Adenichard J Taavon, MD;  Location: [facility];  Service: Obstetrics;  Laterality: N/A;  . Wisdom tooth extraction     Family History: family history includes Cancer in her other; Hyperlipidemia in her other; Hypertension in her other. There is no history of Other. Social History:  reports that she quit smoking about 14 years ago. Her smoking use included Cigarettes. She has a 3 pack-year smoking history. She has never used smokeless tobacco. She reports that she drinks alcohol. She reports that she does not use illicit drugs. Current facility-administered medications:gentamicin (GARAMYCIN) 290 mg, clindamycin (CLEOCIN) 900 mg in dextrose 5 % 100 mL IVPB, , Intravenous, On Call to OR, Mindy SwazilandJordan Holcombe, RPH;  ketorolac (TORADOL) 30 MG/ML injection 30 mg, 30 mg, Intravenous, Q6H PRN, Leilani AbleFranklin Hatchett, MD;  ketorolac (TORADOL) 30 MG/ML injection 30 mg, 30 mg, Intramuscular, Q6H PRN, Leilani AbleFranklin Hatchett, MD lactated ringers infusion, , Intravenous, Continuous, Phillips GroutPeter Carignan, MD, Last Rate:  125 mL/hr at 07/05/14 0809;  scopolamine (TRANSDERM-SCOP) 1 MG/3DAYS 1.5 mg, 1 patch, Transdermal, Once, Phillips GroutPeter Carignan, MD, 1.5 mg at 07/05/14 0809 Allergies  Allergen Reactions  . Penicillins Hives  . Sulfa Antibiotics Hives      Blood pressure 117/82, pulse 99, temperature 97.7 F (36.5 C), temperature source Oral, resp. rate 20, height 5' 1.5" (1.562 m), weight 71.668 kg (158 lb), SpO2 100.00%. Exam Physical Exam  HPP:  Patient Active Problem List   Diagnosis Date Noted  . Postpartum care following cesarean delivery (8/14, FTD) 04/30/2012    Prenatal labs: ABO, Rh: --/--/A POS (10/16 1345) Antibody: NEG (10/16 1345) Rubella:  Immune RPR: NON REAC (10/16 1345)  HBsAg: Negative (03/19 0000)  HIV: Non-reactive, Non-reactive (03/19 0000)  Genetic testing: AFP1 neg US anato: wnl 1 hr GTT:  Abnormal.  3 hr GTT normal GBS:   neg  Assessment/Plan: 39+ wks previous C/S for repeat C/S.  Surgery and risks reviewed.   Kimberly Nelson,[name] 07/05/2014, 8:57 AM

## 2014-07-05 NOTE — Transfer of Care (Signed)
Immediate Anesthesia Transfer of Care Note  Patient: Kimberly SportsMelanie L Coykendall  Procedure(s) Performed: Procedure(s): Repeat CESAREAN SECTION (N/A)  Patient Location: PACU  Anesthesia Type:Spinal  Level of Consciousness: awake, alert , oriented and patient cooperative  Airway & Oxygen Therapy: Patient Spontanous Breathing  Post-op Assessment: Report given to PACU RN and Post -op Vital signs reviewed and stable  Post vital signs: Reviewed and stable  Complications: No apparent anesthesia complications

## 2014-07-05 NOTE — Anesthesia Postprocedure Evaluation (Signed)
Anesthesia Post Note  Patient: Kimberly Nelson  Procedure(s) Performed: Procedure(s) (LRB): Repeat CESAREAN SECTION (N/A)  Anesthesia type: Spinal  Patient location: PACU  Post pain: Pain level controlled  Post assessment: Post-op Vital signs reviewed  Last Vitals:  Filed Vitals:   07/05/14 1215  BP: 106/65  Pulse: 69  Temp: 36.8 C  Resp: 19    Post vital signs: Reviewed  Level of consciousness: awake  Complications: No apparent anesthesia complications

## 2014-07-05 NOTE — Anesthesia Procedure Notes (Signed)
Spinal  Patient location during procedure: OR Start time: 07/05/2014 9:32 AM End time: 07/05/2014 9:36 AM Staffing Anesthesiologist: Leilani AbleHATCHETT, Lakecia Deschamps Performed by: anesthesiologist  Preanesthetic Checklist Completed: patient identified, surgical consent, pre-op evaluation, timeout performed, IV checked, risks and benefits discussed and monitors and equipment checked Spinal Block Patient position: sitting Prep: site prepped and draped and DuraPrep Patient monitoring: heart rate, cardiac monitor, continuous pulse ox and blood pressure Approach: midline Location: L3-4 Injection technique: single-shot Needle Needle type: Pencan  Needle gauge: 24 G Needle length: 9 cm Assessment Sensory level: T4

## 2014-07-05 NOTE — Lactation Note (Addendum)
This note was copied from the chart of Kimberly Nelson. Lactation Consultation Note Initial visit at 10 hours of age.  Mom reports baby is doing well with latch and feedings.  Mom denies pain.  Baby is asleep in visitors arms.  Mom then attempts to wake baby and he doesn't wake for feeding during my visit.  Mom reports having difficulty with older child with pain, engorgement, poor latch and mastitis in 6 weeks.  Mom is encouraged this baby is doing well.  Mom does have a history of infertility, but not discussed with visitors in room at this time.   Mom reports she is seeing colostrum with hand expression. [facility] given and discussed.  Encouraged to feed with early cues on demand.  Early newborn behavior discussed.  Parents report a small white spot near tip of tongue and plan to discuss with pediatrician.  Mom to call for assist as needed.     Patient Name: Kimberly Cherylann RatelMelanie Farnworth ZOXWR'UToday's Date: 07/05/2014 Reason for consult: Initial assessment   Maternal Data Has patient been taught Hand Expression?: Yes Does the patient have breastfeeding experience prior to this delivery?: Yes  Feeding Feeding Type: Breast Milk Length of feed: 20 min  LATCH Score/Interventions                Intervention(s): Breastfeeding basics reviewed     Lactation Tools Discussed/Used     Consult Status Consult Status: Follow-up Date: 07/06/14 Follow-up type: In-patient    Beverely RisenShoptaw, Arvella MerlesJana Lynn 07/05/2014, 8:13 PM

## 2014-07-06 ENCOUNTER — Encounter (HOSPITAL_COMMUNITY): Payer: Self-pay | Admitting: *Deleted

## 2014-07-06 DIAGNOSIS — F419 Anxiety disorder, unspecified: Secondary | ICD-10-CM | POA: Diagnosis present

## 2014-07-06 DIAGNOSIS — F32A Depression, unspecified: Secondary | ICD-10-CM | POA: Diagnosis present

## 2014-07-06 DIAGNOSIS — F329 Major depressive disorder, single episode, unspecified: Secondary | ICD-10-CM | POA: Diagnosis present

## 2014-07-06 DIAGNOSIS — D62 Acute posthemorrhagic anemia: Secondary | ICD-10-CM | POA: Diagnosis not present

## 2014-07-06 LAB — CBC
HEMATOCRIT: 30.8 % — AB (ref 36.0–46.0)
Hemoglobin: 10.3 g/dL — ABNORMAL LOW (ref 12.0–15.0)
MCH: 31.3 pg (ref 26.0–34.0)
MCHC: 33.4 g/dL (ref 30.0–36.0)
MCV: 93.6 fL (ref 78.0–100.0)
Platelets: 190 10*3/uL (ref 150–400)
RBC: 3.29 MIL/uL — ABNORMAL LOW (ref 3.87–5.11)
RDW: 16 % — AB (ref 11.5–15.5)
WBC: 12.3 10*3/uL — ABNORMAL HIGH (ref 4.0–10.5)

## 2014-07-06 LAB — BIRTH TISSUE RECOVERY COLLECTION (PLACENTA DONATION)

## 2014-07-06 NOTE — Progress Notes (Signed)
[name] was referred for history of depression/anxiety.  Referral screened out by Clinical Social Worker because none of the following criteria appear to apply: - History of anxiety/depression during this pregnancy, or of post-partum depression. -Diagnosis of anxiety and/or depression within last 3 years - History of depression due to pregnancy loss/loss of child OR -[name]'s symptoms currently being treated with medication and/or therapy.  [name] is currently prescribed Zoloft.   Please contact the Clinical Social Worker if needs arise or upon [name] request.  

## 2014-07-06 NOTE — Lactation Note (Signed)
This note was copied from the chart of Kimberly Nelson. Lactation Consultation Note    Follow up consult with this mom and term baby, now 2529 hours old. Baby was vigorously breast feeding when I walked into the room. When I asked mom how breast feeding was going, mom said "my nipples are killing me". The baby appeared latched well, but kept pulling back and stretching mom's nipples, causing tenderness, redness and pain. On exam of baby's mouth, her has an upper lip frenulum that extends to the gum line. Mom said both her mom and sister have a space between their front teeth. The baby's tongue has limited movement with elevation, forming a bowl shape, with a slight cleft in the front . The frenulum appears tight and posterior. I advised mom to speak to Dr. Rana SnareLowe, and have her examine the baby's mouth anatomy.  I also told mom the baby appears to be transferring well, and with time, her nipple pain could self resolve. Mom and dad appreciative of the information, and will speak to Dr.Lowe.  Comfort gels given to mom with instruction with use, and mom cautioned to use very good hand hygiene, to prevent nipple/brest infection.   Patient Name: Kimberly Cherylann RatelMelanie Symonette WUXLK'GToday's Date: 07/06/2014 Reason for consult: Follow-up assessment   Maternal Data    Feeding Feeding Type: Breast Fed Length of feed: 10 min  LATCH Score/Interventions Latch: Grasps breast easily, tongue down, lips flanged, rhythmical sucking.  Audible Swallowing: Spontaneous and intermittent  Type of Nipple: Everted at rest and after stimulation  Comfort (Breast/Nipple): Filling, red/small blisters or bruises, mild/mod discomfort  Problem noted: Severe discomfort Interventions (Mild/moderate discomfort): Comfort gels;Hand expression (EBM to nipples)  Hold (Positioning): No assistance needed to correctly position infant at breast. Intervention(s): Breastfeeding basics reviewed;Support Pillows;Position options;Skin to skin  LATCH  Score: 9  Lactation Tools Discussed/Used     Consult Status Consult Status: Follow-up Date: 07/07/14 Follow-up type: In-patient    Kimberly Nelson, Kimberly Nelson Anne 07/06/2014, 3:27 PM

## 2014-07-06 NOTE — Progress Notes (Addendum)
POD # 1  Subjective: Pt reports feeling well/ Pain controlled with Motrin and Percocet Tolerating po/ Foley d/c'd-dur to void/ No n/v/ Flatus present Activity: ad lib Bleeding is light Newborn info:  Information for the patient's newborn:  Michaelyn BarterSumrall, Boy Francille [[id number]][[id number]]  female  / Circumcision: planning/ Feeding: breast   Objective:  VS:  Filed Vitals:   07/05/14 2005 07/05/14 2214 07/06/14 0128 07/06/14 0700  BP: 95/57 99/57 101/62 93/58  Pulse: 73 66 69 72  Temp: 98.2 F (36.8 C) 97.9 F (36.6 C) 98.1 F (36.7 C) 97.9 F (36.6 C)  TempSrc: Oral Oral Oral Oral  Resp: 19 20 20 20   Height:      Weight:      SpO2: 96% 95% 96% 96%     I&O: Intake/Output     10/19 0701 - 10/20 0700 10/20 0701 - 10/21 0700   I.V. (mL/kg) 3483.3 (48.6)    Total Intake(mL/kg) 3483.3 (48.6)    Urine (mL/kg/hr) 3175    Blood 705    Total Output 3880     Net -396.7             Recent Labs  07/06/14 0550  WBC 12.3*  HGB 10.3*  HCT 30.8*  PLT 190    Blood type: --/--/A POS (10/16 1345) Rubella: Immune (03/19 0000)    Physical Exam:  General: alert and cooperative CV: Regular rate and rhythm Resp: CTA bilaterally Abdomen: soft, nontender, normal bowel sounds Incision: healing well, no drainage, no erythema, no hernia, no seroma, no swelling, well approximated, honeycomb dsg c/d/i. Uterine Fundus: firm, below umbilicus, nontender Lochia: minimal Ext: extremities normal, atraumatic, no cyanosis or edema and Homans sign is negative, no sign of DVT    Assessment: POD # 1/ G5P2022/ S/P C/Section d/t repeat ABL anemia Anxiety/depression Doing well  Plan: Ambulate Continue routine post op orders Continue Zoloft, watch closely pp Influenza and Tdap already administered in office  Signed: Donette LarryBHAMBRI, Dayra, N, MSN, CNM 07/06/2014, 10:04 AM

## 2014-07-07 ENCOUNTER — Encounter (HOSPITAL_COMMUNITY): Payer: Self-pay | Admitting: *Deleted

## 2014-07-07 MED ORDER — MAGNESIUM OXIDE 400 MG PO TABS: 400.0000 mg | ORAL_TABLET | Freq: Every day | ORAL | Status: DC

## 2014-07-07 MED ORDER — OXYCODONE-ACETAMINOPHEN 5-325 MG PO TABS: 1.0000 | ORAL_TABLET | ORAL | Status: DC | PRN

## 2014-07-07 MED ORDER — IBUPROFEN 800 MG PO TABS: 800.0000 mg | ORAL_TABLET | Freq: Three times a day (TID) | ORAL | Status: DC

## 2014-07-07 MED ORDER — IBUPROFEN 800 MG PO TABS
800.0000 mg | ORAL_TABLET | Freq: Three times a day (TID) | ORAL | Status: DC
  Administered 2014-07-07: 800 mg via ORAL
  Filled 2014-07-07: qty 1

## 2014-07-07 NOTE — Discharge Summary (Signed)
POSTOPERATIVE DISCHARGE SUMMARY:  Patient ID: Kimberly SportsMelanie L Szuch MRN: [id number] DOB/AGE: 36/11/1975 36 y.o.  Admit date: 07/05/2014 Admission Diagnoses: 39.3 weeks / previous cesarean section / elective repeat  Discharge date:  07/07/2014 Discharge Diagnoses: POD 2 s/p repeat cesarean section  Prenatal history: [id number]   EDC : 07/09/2014, by Other Basis  [facility]  Primary provider : Ernestina PennaFogleman Prenatal course complicated by infertility / IVF pregnancy / anxiety requiring medication management / AMA / previous cesarean section  Prenatal Labs: ABO, Rh: --/--/A POS (10/16 1345)  Antibody: NEG (10/16 1345) Rubella: Immune (03/19 0000)   RPR: NON REAC (10/16 1345)  HBsAg: Negative (03/19 0000)  HIV: Non-reactive, Non-reactive (03/19 0000)  GTT : normal 3hr GTT   Medical / Surgical History :  Past medical history:  Past Medical History  Diagnosis Date  . Urinary tract infection     Hx - resolved  . Anxiety     on meds, doing well  . Abnormal Pap smear     colpo, ok since  . Depression   . ASCUS (atypical squamous cells of undetermined significance) on Pap smear   . Laryngopharyngeal reflux   . Infertility associated with anovulation   . Artificial insemination     Hx only  . Postpartum care following cesarean delivery (8/14, FTD) 04/30/2012    resolved  . Missed ab 01/2013    6 wks  . Termination of pregnancy (fetus)     10 wks  . Seasonal allergies   . Hemorrhoids   . Heartburn in pregnancy   . Postpartum care following cesarean delivery (10/19) 07/05/2014    Past surgical history:  Past Surgical History  Procedure Laterality Date  . Induced abortion    . Cesarean section  04/30/2012    Procedure: CESAREAN SECTION;  Surgeon: Lenoard Adenichard J Taavon, MD;  Location: [facility];  Service: Obstetrics;  Laterality: N/A;  . Wisdom tooth extraction    . Cesarean section N/A 07/05/2014    Procedure: Repeat CESAREAN SECTION;  Surgeon:  Genia DelMarie-Lyne Lavoie, MD;  Location: [facility];  Service: Obstetrics;  Laterality: N/A;    Family History:  Family History  Problem Relation Age of Onset  . Cancer Other   . Hypertension Other   . Hyperlipidemia Other   . Other Neg Hx     Social History:  reports that she quit smoking about 14 years ago. Her smoking use included Cigarettes. She has a 3 pack-year smoking history. She has never used smokeless tobacco. She reports that she drinks alcohol. She reports that she does not use illicit drugs.  Allergies: Penicillins and Sulfa antibiotics   Current Medications at time of admission:  Prior to Admission medications   Medication Sig Start Date End Date Taking? Authorizing Provider  loratadine (CLARITIN) 10 MG tablet Take 10 mg by mouth at bedtime.    Yes Historical Provider, MD  Prenatal Vit-Fe Fumarate-FA (MULTIVITAMIN-PRENATAL) 27-0.8 MG TABS tablet Take 1 tablet by mouth at bedtime.    Yes Historical Provider, MD  ranitidine (ZANTAC) 150 MG tablet Take 150 mg by mouth at bedtime.    Yes Historical Provider, MD  sertraline (ZOLOFT) 50 MG tablet Take 75 mg by mouth at bedtime.    Yes Historical Provider, MD  docusate sodium (COLACE) 50 MG capsule Take 50 mg by mouth 2 (two) times daily.    Historical Provider, MD    Procedures: Cesarean section delivery on 07/05/2014 with delivery of female newborn by Dr Ernestina PennaFogleman  See operative report for further details APGAR (1 MIN): 9   APGAR (5 MINS): 9    Postoperative / postpartum course:  Uncomplicated with discharge on POD 2  Discharge Instructions:  Discharged Condition: stable  Activity: pelvic rest and postoperative restrictions x 2   Diet: routine  Medications:    Medication List         docusate sodium 50 MG capsule  Commonly known as:  COLACE  Take 50 mg by mouth 2 (two) times daily.     ibuprofen 800 MG tablet  Commonly known as:  ADVIL,MOTRIN  Take 1 tablet (800 mg total) by mouth every 8 (eight) hours.      loratadine 10 MG tablet  Commonly known as:  CLARITIN  Take 10 mg by mouth at bedtime.     magnesium oxide 400 MG tablet  Commonly known as:  MAG-OX  Take 1 tablet (400 mg total) by mouth daily.     multivitamin-prenatal 27-0.8 MG Tabs tablet  Take 1 tablet by mouth at bedtime.     oxyCODONE-acetaminophen 5-325 MG per tablet  Commonly known as:  PERCOCET/ROXICET  Take 1 tablet by mouth every 4 (four) hours as needed (for pain scale less than 7).     ranitidine 150 MG tablet  Commonly known as:  ZANTAC  Take 150 mg by mouth at bedtime.     sertraline 50 MG tablet  Commonly known as:  ZOLOFT  Take 75 mg by mouth at bedtime.        Wound Care: keep clean and dry / remove honeycomb POD 5 ( Friday ) Postpartum Instructions: [facility] discharge booklet - instructions reviewed  Discharge to: Home  Follow up :   [facility] in 6 weeks for routine postpartum visit with Dr Ernestina PennaFogleman                Signed: Marlinda MikeBAILEY, Innocence Schlotzhauer CNM, MSN, [facility]AC[facility] 07/07/2014, 9:12 AM

## 2014-07-07 NOTE — Lactation Note (Signed)
This note was copied from the chart of Kimberly Toi Brining. Lactation Consultation Note  Baby leaving for circ.  Mother's nipples are sore.  Provided her with comfort gels. Parents are planning on seeing ENT MD for frenulum issues. Discussed lots of STS and hand pump, spoon feeding if needed if baby sleepy this afternoon. Mother has history of mastitis.  Discussed massage, keep nipples clean and no lanolin.  Encouraged applying ebm, gels and pumping if nipples become too sore.  Patient Name: Kimberly Cherylann RatelMelanie Constancio ZOXWR'UToday's Date: Nelson Reason for consult: Follow-up assessment   Maternal Data    Feeding Feeding Type: Breast Fed Length of feed: 30 min  LATCH Score/Interventions                      Lactation Tools Discussed/Used     Consult Status Consult Status: Complete    Hardie PulleyBerkelhammer, Dareon Nunziato Boschen Nelson, 10:42 AM

## 2014-07-07 NOTE — Progress Notes (Signed)
POSTOPERATIVE DAY # 2 S/P CS   S:         Reports feeling well - desires DC home today             Tolerating po intake / no nausea / no vomiting / + flatus / no BM             Bleeding is light             Pain controlled with motrin and percocet             Up ad lib / ambulatory/ voiding QS  Newborn breast feeding  / planned prior to DC  O:  VS: BP 100/66  Pulse 66  Temp(Src) 97.5 F (36.4 C) (Oral)  Resp 19  Ht 5' 1.5" (1.562 m)  Wt 71.668 kg (158 lb)  BMI 29.37 kg/m2  SpO2 100%  Breastfeeding? Unknown   LABS:               Recent Labs  07/06/14 0550  WBC 12.3*  HGB 10.3*  PLT 190               Bloodtype: --/--/A POS (10/16 1345)  Rubella: Immune (03/19 0000)                                                Physical Exam:             Alert and Oriented X3  Lungs: Clear and unlabored  Heart: regular rate and rhythm / no mumurs  Abdomen: soft, non-tender, non-distended, active BS             Fundus: firm, non-tender, U-1             Dressing intact honeycomb              Incision:  approximated with sutures / no erythema / no ecchymosis / no drainage  Perineum: intact  Lochia: light  Extremities: no edema, no calf pain or tenderness, neg Homans  A:        POD # 2 S/P CS             P:        Routine postoperative care              DC home - WOB booklet - instructions reviewed   Marlinda MikeBAILEY, Monta Maiorana CNM, MSN, FACNM 07/07/2014, 8:40 AM

## 2014-07-07 NOTE — Discharge Instructions (Signed)
Drink at least 4 WATER bottles per day while breast feeding  Magnesium RX for constipation prevention - 1/2 - 1 tablet every day  RECOMMEND vitamin D supplement 2000iu daily while breastfeeding

## 2014-07-19 ENCOUNTER — Encounter (HOSPITAL_COMMUNITY): Payer: Self-pay | Admitting: *Deleted

## 2017-09-13 ENCOUNTER — Encounter: Payer: Self-pay | Admitting: Obstetrics & Gynecology

## 2017-09-13 ENCOUNTER — Ambulatory Visit (INDEPENDENT_AMBULATORY_CARE_PROVIDER_SITE_OTHER): Payer: 59 | Admitting: Obstetrics & Gynecology

## 2017-09-13 VITALS — BP 120/72 | Ht 60.25 in | Wt 121.0 lb

## 2017-09-13 DIAGNOSIS — Z1151 Encounter for screening for human papillomavirus (HPV): Secondary | ICD-10-CM | POA: Diagnosis not present

## 2017-09-13 DIAGNOSIS — Z01419 Encounter for gynecological examination (general) (routine) without abnormal findings: Secondary | ICD-10-CM | POA: Diagnosis not present

## 2017-09-13 DIAGNOSIS — Z9189 Other specified personal risk factors, not elsewhere classified: Secondary | ICD-10-CM

## 2017-09-13 NOTE — Progress Notes (Signed)
Kimberly Nelson 09/09/1976 [id number]   History:    41 y.o. G4P2A2L2 Married.  Husband has a Vasectomy.  Boys 3 and 39 yo  RP:  Established patient presenting for annual gyn exam  HPI: Menstrual periods every months with normal flow.  No pelvic pain.  Normal vaginal secretions.  Husband has a vasectomy.  Breasts normal.  Urine and bowel movements normal.  Has a small external hemorrhoid which is improving.  No rectal bleeding.  Patient started a coffee bland marketed for weight loss.  Was able to lose 10 pounds on it.  Current body mass index 23.44.  Good nutrition and physically active regularly.  Past medical history,surgical history, family history and social history were all reviewed and documented in the EPIC chart.  Gynecologic History Patient's last menstrual period was 08/26/2017. Contraception: vasectomy Last Pap: 2014. Results were: Negative/HPV HR neg Last mammogram: Never  Obstetric History OB History  Gravida Para Term Preterm AB Living  4 2 2  0 2 2  SAB TAB Ectopic Multiple Live Births  1 1 0 0 2    # Outcome Date GA Lbr Len/2nd Weight Sex Delivery Anes PTL Lv  4 Term 07/05/14 5565w3d   M CS-LTranv Spinal  LIV  3 SAB 01/2013 8721w0d         2 Term 04/30/12 8733w4d 19:10 / 06:59 8 lb 15.9 oz (4.08 kg) M CS-LTranv EPI  LIV     Birth Comments: WNL  1 TAB  1771w0d              ROS: A ROS was performed and pertinent positives and negatives are included in the history.  GENERAL: No fevers or chills. HEENT: No change in vision, no earache, sore throat or sinus congestion. NECK: No pain or stiffness. CARDIOVASCULAR: No chest pain or pressure. No palpitations. PULMONARY: No shortness of breath, cough or wheeze. GASTROINTESTINAL: No abdominal pain, nausea, vomiting or diarrhea, melena or bright red blood per rectum. GENITOURINARY: No urinary frequency, urgency, hesitancy or dysuria. MUSCULOSKELETAL: No joint or muscle pain, no back pain, no recent trauma. DERMATOLOGIC: No rash, no  itching, no lesions. ENDOCRINE: No polyuria, polydipsia, no heat or cold intolerance. No recent change in weight. HEMATOLOGICAL: No anemia or easy bruising or bleeding. NEUROLOGIC: No headache, seizures, numbness, tingling or weakness. PSYCHIATRIC: No depression, no loss of interest in normal activity or change in sleep pattern.     Exam:   BP 120/72   Ht 5' 0.25" (1.53 m)   Wt 121 lb (54.9 kg)   LMP 08/26/2017   BMI 23.44 kg/m   Body mass index is 23.44 kg/m.  General appearance : Well developed well nourished female. No acute distress HEENT: Eyes: no retinal hemorrhage or exudates,  Neck supple, trachea midline, no carotid bruits, no thyroidmegaly Lungs: Clear to auscultation, no rhonchi or wheezes, or rib retractions  Heart: Regular rate and rhythm, no murmurs or gallops Breast:Examined in sitting and supine position were symmetrical in appearance, no palpable masses or tenderness,  no skin retraction, no nipple inversion, no nipple discharge, no skin discoloration, no axillary or supraclavicular lymphadenopathy Abdomen: no palpable masses or tenderness, no rebound or guarding Extremities: no edema or skin discoloration or tenderness  Pelvic: Vulva normal  Bartholin, Urethra, Skene Glands: Within normal limits             Vagina: No gross lesions or discharge  Cervix: No gross lesions or discharge.  Pap/HR HPV done.  Uterus  AV, normal size,  shape and consistency, non-tender and mobile  Adnexa  Without masses or tenderness  Anus and perineum  normal    Assessment/Plan:  41 y.o. female for annual exam   1. Encounter for routine gynecological examination with Papanicolaou smear of cervix Normal gynecologic exam.  Pap with high risk HPV done.  Breast exam normal.  Will start screening mammogram at 42.  Health labs with family physician.  2. Relies on partner vasectomy for contraception   Kimberly DelMarie-Lyne [name] MD, 3:18 PM 09/13/2017

## 2017-09-13 NOTE — Patient Instructions (Signed)
1. Encounter for routine gynecological examination with Papanicolaou smear of cervix Normal gynecologic exam.  Pap with high risk HPV done.  Breast exam normal.  Will start screening mammogram at 42.  Health labs with family physician.  2. Relies on partner vasectomy for contraception  Kimberly Nelson, it was a pleasure seeing you today!  I will inform you of your results as soon as they are available.  Health Maintenance, Female Adopting a healthy lifestyle and getting preventive care can go a long way to promote health and wellness. Talk with your health care provider about what schedule of regular examinations is right for you. This is a good chance for you to check in with your provider about disease prevention and staying healthy. In between checkups, there are plenty of things you can do on your own. Experts have done a lot of research about which lifestyle changes and preventive measures are most likely to keep you healthy. Ask your health care provider for more information. Weight and diet Eat a healthy diet  Be sure to include plenty of vegetables, fruits, low-fat dairy products, and lean protein.  Do not eat a lot of foods high in solid fats, added sugars, or salt.  Get regular exercise. This is one of the most important things you can do for your health. ? Most adults should exercise for at least 150 minutes each week. The exercise should increase your heart rate and make you sweat (moderate-intensity exercise). ? Most adults should also do strengthening exercises at least twice a week. This is in addition to the moderate-intensity exercise.  Maintain a healthy weight  Body mass index (BMI) is a measurement that can be used to identify possible weight problems. It estimates body fat based on height and weight. Your health care provider can help determine your BMI and help you achieve or maintain a healthy weight.  For females 16 years of age and older: ? A BMI below 18.5 is considered  underweight. ? A BMI of 18.5 to 24.9 is normal. ? A BMI of 25 to 29.9 is considered overweight. ? A BMI of 30 and above is considered obese.  Watch levels of cholesterol and blood lipids  You should start having your blood tested for lipids and cholesterol at 39 years of age, then have this test every 5 years.  You may need to have your cholesterol levels checked more often if: ? Your lipid or cholesterol levels are high. ? You are older than 39 years of age. ? You are at high risk for heart disease.  Cancer screening Lung Cancer  Lung cancer screening is recommended for adults 49-53 years old who are at high risk for lung cancer because of a history of smoking.  A yearly low-dose CT scan of the lungs is recommended for people who: ? Currently smoke. ? Have quit within the past 15 years. ? Have at least a 30-pack-year history of smoking. A pack year is smoking an average of one pack of cigarettes a day for 1 year.  Yearly screening should continue until it has been 15 years since you quit.  Yearly screening should stop if you develop a health problem that would prevent you from having lung cancer treatment.  Breast Cancer  Practice breast self-awareness. This means understanding how your breasts normally appear and feel.  It also means doing regular breast self-exams. Let your health care provider know about any changes, no matter how small.  If you are in your 20s or 30s,  you should have a clinical breast exam (CBE) by a health care provider every 1-3 years as part of a regular health exam.  If you are 40 or older, have a CBE every year. Also consider having a breast X-ray (mammogram) every year.  If you have a family history of breast cancer, talk to your health care provider about genetic screening.  If you are at high risk for breast cancer, talk to your health care provider about having an MRI and a mammogram every year.  Breast cancer gene (BRCA) assessment is  recommended for women who have family members with BRCA-related cancers. BRCA-related cancers include: ? Breast. ? Ovarian. ? Tubal. ? Peritoneal cancers.  Results of the assessment will determine the need for genetic counseling and BRCA1 and BRCA2 testing.  Cervical Cancer Your health care provider may recommend that you be screened regularly for cancer of the pelvic organs (ovaries, uterus, and vagina). This screening involves a pelvic examination, including checking for microscopic changes to the surface of your cervix (Pap test). You may be encouraged to have this screening done every 3 years, beginning at age 21.  For women ages 30-65, health care providers may recommend pelvic exams and Pap testing every 3 years, or they may recommend the Pap and pelvic exam, combined with testing for human papilloma virus (HPV), every 5 years. Some types of HPV increase your risk of cervical cancer. Testing for HPV may also be done on women of any age with unclear Pap test results.  Other health care providers may not recommend any screening for nonpregnant women who are considered low risk for pelvic cancer and who do not have symptoms. Ask your health care provider if a screening pelvic exam is right for you.  If you have had past treatment for cervical cancer or a condition that could lead to cancer, you need Pap tests and screening for cancer for at least 20 years after your treatment. If Pap tests have been discontinued, your risk factors (such as having a new sexual partner) need to be reassessed to determine if screening should resume. Some women have medical problems that increase the chance of getting cervical cancer. In these cases, your health care provider may recommend more frequent screening and Pap tests.  Colorectal Cancer  This type of cancer can be detected and often prevented.  Routine colorectal cancer screening usually begins at 39 years of age and continues through 39 years of  age.  Your health care provider may recommend screening at an earlier age if you have risk factors for colon cancer.  Your health care provider may also recommend using home test kits to check for hidden blood in the stool.  A small camera at the end of a tube can be used to examine your colon directly (sigmoidoscopy or colonoscopy). This is done to check for the earliest forms of colorectal cancer.  Routine screening usually begins at age 50.  Direct examination of the colon should be repeated every 5-10 years through 39 years of age. However, you may need to be screened more often if early forms of precancerous polyps or small growths are found.  Skin Cancer  Check your skin from head to toe regularly.  Tell your health care provider about any new moles or changes in moles, especially if there is a change in a mole's shape or color.  Also tell your health care provider if you have a mole that is larger than the size of a pencil eraser.    Always use sunscreen. Apply sunscreen liberally and repeatedly throughout the day.  Protect yourself by wearing long sleeves, pants, a wide-brimmed hat, and sunglasses whenever you are outside.  Heart disease, diabetes, and high blood pressure  High blood pressure causes heart disease and increases the risk of stroke. High blood pressure is more likely to develop in: ? People who have blood pressure in the high end of the normal range (130-139/85-89 mm Hg). ? People who are overweight or obese. ? People who are African American.  If you are 18-39 years of age, have your blood pressure checked every 3-5 years. If you are 40 years of age or older, have your blood pressure checked every year. You should have your blood pressure measured twice-once when you are at a hospital or clinic, and once when you are not at a hospital or clinic. Record the average of the two measurements. To check your blood pressure when you are not at a hospital or clinic, you  can use: ? An automated blood pressure machine at a pharmacy. ? A home blood pressure monitor.  If you are between 55 years and 79 years old, ask your health care provider if you should take aspirin to prevent strokes.  Have regular diabetes screenings. This involves taking a blood sample to check your fasting blood sugar level. ? If you are at a normal weight and have a low risk for diabetes, have this test once every three years after 39 years of age. ? If you are overweight and have a high risk for diabetes, consider being tested at a younger age or more often. Preventing infection Hepatitis B  If you have a higher risk for hepatitis B, you should be screened for this virus. You are considered at high risk for hepatitis B if: ? You were born in a country where hepatitis B is common. Ask your health care provider which countries are considered high risk. ? Your parents were born in a high-risk country, and you have not been immunized against hepatitis B (hepatitis B vaccine). ? You have HIV or AIDS. ? You use needles to inject street drugs. ? You live with someone who has hepatitis B. ? You have had sex with someone who has hepatitis B. ? You get hemodialysis treatment. ? You take certain medicines for conditions, including cancer, organ transplantation, and autoimmune conditions.  Hepatitis C  Blood testing is recommended for: ? Everyone born from 1945 through 1965. ? Anyone with known risk factors for hepatitis C.  Sexually transmitted infections (STIs)  You should be screened for sexually transmitted infections (STIs) including gonorrhea and chlamydia if: ? You are sexually active and are younger than 39 years of age. ? You are older than 39 years of age and your health care provider tells you that you are at risk for this type of infection. ? Your sexual activity has changed since you were last screened and you are at an increased risk for chlamydia or gonorrhea. Ask your  health care provider if you are at risk.  If you do not have HIV, but are at risk, it may be recommended that you take a prescription medicine daily to prevent HIV infection. This is called pre-exposure prophylaxis (PrEP). You are considered at risk if: ? You are sexually active and do not regularly use condoms or know the HIV status of your partner(s). ? You take drugs by injection. ? You are sexually active with a partner who has HIV.  Talk with   your health care provider about whether you are at high risk of being infected with HIV. If you choose to begin PrEP, you should first be tested for HIV. You should then be tested every 3 months for as long as you are taking PrEP. Pregnancy  If you are premenopausal and you may become pregnant, ask your health care provider about preconception counseling.  If you may become pregnant, take 400 to 800 micrograms (mcg) of folic acid every day.  If you want to prevent pregnancy, talk to your health care provider about birth control (contraception). Osteoporosis and menopause  Osteoporosis is a disease in which the bones lose minerals and strength with aging. This can result in serious bone fractures. Your risk for osteoporosis can be identified using a bone density scan.  If you are 15 years of age or older, or if you are at risk for osteoporosis and fractures, ask your health care provider if you should be screened.  Ask your health care provider whether you should take a calcium or vitamin D supplement to lower your risk for osteoporosis.  Menopause may have certain physical symptoms and risks.  Hormone replacement therapy may reduce some of these symptoms and risks. Talk to your health care provider about whether hormone replacement therapy is right for you. Follow these instructions at home:  Schedule regular health, dental, and eye exams.  Stay current with your immunizations.  Do not use any tobacco products including cigarettes, chewing  tobacco, or electronic cigarettes.  If you are pregnant, do not drink alcohol.  If you are breastfeeding, limit how much and how often you drink alcohol.  Limit alcohol intake to no more than 1 drink per day for nonpregnant women. One drink equals 12 ounces of beer, 5 ounces of wine, or 1 ounces of hard liquor.  Do not use street drugs.  Do not share needles.  Ask your health care provider for help if you need support or information about quitting drugs.  Tell your health care provider if you often feel depressed.  Tell your health care provider if you have ever been abused or do not feel safe at home. This information is not intended to replace advice given to you by your health care provider. Make sure you discuss any questions you have with your health care provider. Document Released: 03/19/2011 Document Revised: 02/09/2016 Document Reviewed: 06/07/2015 Elsevier Interactive Patient Education  [employer].

## 2017-09-18 LAB — PAP, TP IMAGING W/ HPV RNA, RFLX HPV TYPE 16,18/45: HPV DNA High Risk: NOT DETECTED

## 2018-02-25 DIAGNOSIS — J029 Acute pharyngitis, unspecified: Secondary | ICD-10-CM | POA: Diagnosis not present

## 2018-06-27 DIAGNOSIS — Z23 Encounter for immunization: Secondary | ICD-10-CM | POA: Diagnosis not present

## 2018-07-03 ENCOUNTER — Ambulatory Visit (INDEPENDENT_AMBULATORY_CARE_PROVIDER_SITE_OTHER): Payer: 59 | Admitting: Obstetrics & Gynecology

## 2018-07-03 ENCOUNTER — Encounter: Payer: Self-pay | Admitting: Obstetrics & Gynecology

## 2018-07-03 ENCOUNTER — Telehealth: Payer: Self-pay | Admitting: *Deleted

## 2018-07-03 VITALS — BP 130/82

## 2018-07-03 DIAGNOSIS — F419 Anxiety disorder, unspecified: Secondary | ICD-10-CM | POA: Diagnosis not present

## 2018-07-03 DIAGNOSIS — F329 Major depressive disorder, single episode, unspecified: Secondary | ICD-10-CM | POA: Diagnosis not present

## 2018-07-03 DIAGNOSIS — N943 Premenstrual tension syndrome: Secondary | ICD-10-CM | POA: Diagnosis not present

## 2018-07-03 MED ORDER — NORETHINDRONE ACET-ETHINYL EST 1-20 MG-MCG PO TABS: 1.0000 | ORAL_TABLET | Freq: Every day | ORAL | 4 refills | Status: DC

## 2018-07-03 MED ORDER — ALPRAZOLAM 0.25 MG PO TABS: 0.2500 mg | ORAL_TABLET | Freq: Two times a day (BID) | ORAL | 2 refills | Status: DC | PRN

## 2018-07-03 MED ORDER — NORETHIN ACE-ETH ESTRAD-FE 1-20 MG-MCG(24) PO TABS: 1.0000 | ORAL_TABLET | Freq: Every day | ORAL | 4 refills | Status: DC

## 2018-07-03 NOTE — Progress Notes (Signed)
    Kimberly Nelson [birth year] [id number]        42 y.o.  [id number]  Married.  Vasectomy.  Sons 4 and 6 yo  RP: Increased depression Sxs and anxiety with PMS  HPI: Patient has been on Zoloft for many years.  She has needed an increase in dosage progressively.  Currently on 200 mg daily.  In spite of that, feels more depressive symptoms in the last couple months accompanied by occasional anxiety.  Her symptoms are worse in the premenstrual period.  She is having regular menstrual cycles every month.  Using vasectomy for contraception.  Patient is working full-time and taking care of her children.  She does not feel completely supported by her husband and in her role as a mother.  They have frequent arguments about that and it hurts her.  She has scheduled a visit for psychotherapy/counseling next week.  No suicidal ideation.  Continues to function at work.  Eating well and sleeping well for the most part.  Not doing regular fitness activities.   OB History  Gravida Para Term Preterm AB Living  4 2 2  0 2 2  SAB TAB Ectopic Multiple Live Births  1 1 0 0 2    # Outcome Date GA Lbr Len/2nd Weight Sex Delivery Anes PTL Lv  4 Term 07/05/14 [redacted]w[redacted]d   M CS-LTranv Spinal  LIV  3 SAB 01/2013 [redacted]w[redacted]d         2 Term 04/30/12 [redacted]w[redacted]d 19:10 / 06:59 8 lb 15.9 oz (4.08 kg) M CS-LTranv EPI  LIV     Birth Comments: WNL  1 TAB  [redacted]w[redacted]d           Past medical history,surgical history, problem list, medications, allergies, family history and social history were all reviewed and documented in the EPIC chart.   Directed ROS with pertinent positives and negatives documented in the history of present illness/assessment and plan.  Exam:  Vitals:   07/03/18 0914  BP: 130/82   General appearance:  Normal  Gynecologic exam: Deferred   Assessment/Plan:  42 y.o. [id number]   1. Reactive depression (situational) No symptom of major depression with no suicidal ideation.  Situational depression worsening recently.  Will  continue Zoloft 200 mg daily.  Given that the symptoms are worse in the premenstrual period, will block the cycle with a low-dose birth control pill.  Usage risks and benefits of birth control pill reviewed.  Prescription sent to pharmacy.  Recommend increasing physical activity to release endorphins and help with depression and anxiety.  Will start psychotherapy next week.  2. Anxiety Increased physical activity will probably help with anxiety symptoms.  Also starting psychotherapy.  Will use Xanax 0.25 mg up to twice a day as needed.  Prescription sent to pharmacy.  3. PMS (premenstrual syndrome) Starting low-dose birth control pill to block ovulation and reduce PMS symptoms.  Other orders - Norethindrone Acetate-Ethinyl Estrad-FE (LOESTRIN 24 FE) 1-20 MG-MCG(24) tablet; Take 1 tablet by mouth daily. - ALPRAZolam (XANAX) 0.25 MG tablet; Take 1 tablet (0.25 mg total) by mouth 2 (two) times daily as needed for anxiety.  Counseling on above issues and coordination of care more than 50% for 25 minutes.  Genia Del MD, 9:28 AM 07/03/2018

## 2018-07-03 NOTE — Telephone Encounter (Signed)
Yes, agree with change. 

## 2018-07-03 NOTE — Patient Instructions (Signed)
1. Reactive depression (situational) No symptom of major depression with no suicidal ideation.  Situational depression worsening recently.  Will continue Zoloft 200 mg daily.  Given that the symptoms are worse in the premenstrual period, will block the cycle with a low-dose birth control pill.  Usage risks and benefits of birth control pill reviewed.  Prescription sent to pharmacy.  Recommend increasing physical activity to release endorphins and help with depression and anxiety.  Will start psychotherapy next week.  2. Anxiety Increased physical activity will probably help with anxiety symptoms.  Also starting psychotherapy.  Will use Xanax 0.25 mg up to twice a day as needed.  Prescription sent to pharmacy.  3. PMS (premenstrual syndrome) Starting low-dose birth control pill to block ovulation and reduce PMS symptoms.  Other orders - Norethindrone Acetate-Ethinyl Estrad-FE (LOESTRIN 24 FE) 1-20 MG-MCG(24) tablet; Take 1 tablet by mouth daily. - ALPRAZolam (XANAX) 0.25 MG tablet; Take 1 tablet (0.25 mg total) by mouth 2 (two) times daily as needed for anxiety.  Kimberly Nelson, it was a pleasure seeing you today!

## 2018-07-03 NOTE — Telephone Encounter (Signed)
Left on voicemail Rx sent 

## 2018-07-03 NOTE — Telephone Encounter (Signed)
Patient was prescribed Loestrin 24 FE today, medication is too expensive. Pharmacist told her Loestrin 1/20 is cheaper, okay to switch Rx?

## 2018-08-25 DIAGNOSIS — N943 Premenstrual tension syndrome: Secondary | ICD-10-CM | POA: Diagnosis not present

## 2018-08-25 DIAGNOSIS — Z0000 Encounter for general adult medical examination without abnormal findings: Secondary | ICD-10-CM | POA: Diagnosis not present

## 2018-08-25 DIAGNOSIS — R5383 Other fatigue: Secondary | ICD-10-CM | POA: Diagnosis not present

## 2018-09-22 ENCOUNTER — Ambulatory Visit (INDEPENDENT_AMBULATORY_CARE_PROVIDER_SITE_OTHER): Payer: 59 | Admitting: Obstetrics & Gynecology

## 2018-09-22 ENCOUNTER — Encounter: Payer: Self-pay | Admitting: Obstetrics & Gynecology

## 2018-09-22 VITALS — BP 116/72 | Ht 61.25 in | Wt 132.0 lb

## 2018-09-22 DIAGNOSIS — Z3041 Encounter for surveillance of contraceptive pills: Secondary | ICD-10-CM | POA: Diagnosis not present

## 2018-09-22 DIAGNOSIS — F419 Anxiety disorder, unspecified: Secondary | ICD-10-CM | POA: Diagnosis not present

## 2018-09-22 DIAGNOSIS — F329 Major depressive disorder, single episode, unspecified: Secondary | ICD-10-CM

## 2018-09-22 DIAGNOSIS — Z01419 Encounter for gynecological examination (general) (routine) without abnormal findings: Secondary | ICD-10-CM | POA: Diagnosis not present

## 2018-09-22 DIAGNOSIS — F32A Depression, unspecified: Secondary | ICD-10-CM

## 2018-09-22 MED ORDER — NORETHINDRONE ACET-ETHINYL EST 1-20 MG-MCG PO TABS: 1.0000 | ORAL_TABLET | Freq: Every day | ORAL | 4 refills | Status: DC

## 2018-09-22 NOTE — Progress Notes (Signed)
ASHANTIA PREVETT September 30, 1975 [id number]   History:    43 y.o. [id number] Married.    RP:  Established patient presenting for annual gyn exam   HPI: Well on Junel 1/20 continuous use for PMS.  Very mild occasional spotting.  No pelvic pain.  No pain with intercourse.  Breast normal.  Urine and bowel movements normal.  Stable mood with no major depressive symptoms currently on Zoloft 200 mg daily.  Has not needed Xanax for anxiety.  Considering psychotherapy.  Health labs with family physician.  Past medical history,surgical history, family history and social history were all reviewed and documented in the EPIC chart.  Gynecologic History Patient's last menstrual period was 08/22/2018. Contraception: OCP (estrogen/progesterone) Last Pap: 08/2017.  Results were: Negative/HPV HR neg Last mammogram: [facility] Bone Density: Never Colonoscopy: Never  Obstetric History OB History  Gravida Para Term Preterm AB Living  4 2 2  0 2 2  SAB TAB Ectopic Multiple Live Births  1 1 0 0 2    # Outcome Date GA Lbr Len/2nd Weight Sex Delivery Anes PTL Lv  4 Term 07/05/14 [redacted]w[redacted]d   M CS-LTranv Spinal  LIV  3 SAB 01/2013 [redacted]w[redacted]d         2 Term 04/30/12 [redacted]w[redacted]d 19:10 / 06:59 8 lb 15.9 oz (4.08 kg) M CS-LTranv EPI  LIV     Birth Comments: WNL  1 TAB  [redacted]w[redacted]d            ROS: A ROS was performed and pertinent positives and negatives are included in the history.  GENERAL: No fevers or chills. HEENT: No change in vision, no earache, sore throat or sinus congestion. NECK: No pain or stiffness. CARDIOVASCULAR: No chest pain or pressure. No palpitations. PULMONARY: No shortness of breath, cough or wheeze. GASTROINTESTINAL: No abdominal pain, nausea, vomiting or diarrhea, melena or bright red blood per rectum. GENITOURINARY: No urinary frequency, urgency, hesitancy or dysuria. MUSCULOSKELETAL: No joint or muscle pain, no back pain, no recent trauma. DERMATOLOGIC: No rash, no itching, no  lesions. ENDOCRINE: No polyuria, polydipsia, no heat or cold intolerance. No recent change in weight. HEMATOLOGICAL: No anemia or easy bruising or bleeding. NEUROLOGIC: No headache, seizures, numbness, tingling or weakness. PSYCHIATRIC: No depression, no loss of interest in normal activity or change in sleep pattern.     Exam:   BP 116/72   Ht 5' 1.25" (1.556 m)   Wt 132 lb (59.9 kg)   LMP 08/22/2018   BMI 24.74 kg/m   Body mass index is 24.74 kg/m.  General appearance : Well developed well nourished female. No acute distress HEENT: Eyes: no retinal hemorrhage or exudates,  Neck supple, trachea midline, no carotid bruits, no thyroidmegaly Lungs: Clear to auscultation, no rhonchi or wheezes, or rib retractions  Heart: Regular rate and rhythm, no murmurs or gallops Breast:Examined in sitting and supine position were symmetrical in appearance, no palpable masses or tenderness,  no skin retraction, no nipple inversion, no nipple discharge, no skin discoloration, no axillary or supraclavicular lymphadenopathy Abdomen: no palpable masses or tenderness, no rebound or guarding Extremities: no edema or skin discoloration or tenderness  Pelvic: Vulva: Normal             Vagina: No gross lesions or discharge  Cervix: No gross lesions or discharge  Uterus  AV, normal size, shape and consistency, non-tender and mobile  Adnexa  Without masses or tenderness  Anus: Normal   Assessment/Plan:  42 y.o.  female for annual exam   1. Well female exam with routine gynecological exam Normal gynecologic exam.  Pap test negative with negative high-risk HPV December 2018.  No indication to repeat this year.  Breast exam normal.  Will schedule screening mammogram at the breast center now.  Health labs with family physician.  Good body mass index at 24.74.  Encouraged to exercise more regularly.  Continue with healthy nutrition with plenty of vegetables.  2. Encounter for surveillance of contraceptive  pills Well on continuous Junel 1/20.  No contraindication to continue on birth control pill.  Junel 1/20 represcribed.  3. Anxiety and depression Doing well with no symptoms of major depression on Zoloft 200 mg daily.  Considering psychotherapy.  Has not needed Xanax.  Recommend regular physical activity for fitness and to help with mood issues.  Other orders - norethindrone-ethinyl estradiol (MICROGESTIN,JUNEL,LOESTRIN) 1-20 MG-MCG tablet; Take 1 tablet by mouth daily. Continuous use for PMS  Genia DelMarie-Lyne Candido Flott MD, 4:35 PM 09/22/2018

## 2018-09-22 NOTE — Patient Instructions (Signed)
1. Well female exam with routine gynecological exam Normal gynecologic exam.  Pap test negative with negative high-risk HPV December 2018.  No indication to repeat this year.  Breast exam normal.  Will schedule screening mammogram at the breast center now.  Health labs with family physician.  Good body mass index at 24.74.  Encouraged to exercise more regularly.  Continue with healthy nutrition with plenty of vegetables.  2. Encounter for surveillance of contraceptive pills Well on continuous Junel 1/20.  No contraindication to continue on birth control pill.  Junel 1/20 represcribed.  3. Anxiety and depression Doing well with no symptoms of major depression on Zoloft 200 mg daily.  Considering psychotherapy.  Has not needed Xanax.  Recommend regular physical activity for fitness and to help with mood issues.  Other orders - norethindrone-ethinyl estradiol (MICROGESTIN,JUNEL,LOESTRIN) 1-20 MG-MCG tablet; Take 1 tablet by mouth daily. Continuous use for PMS  Kimberly Nelson, it was a pleasure seeing you today!

## 2018-10-03 ENCOUNTER — Other Ambulatory Visit: Payer: Self-pay | Admitting: Family Medicine

## 2018-10-03 DIAGNOSIS — Z1231 Encounter for screening mammogram for malignant neoplasm of breast: Secondary | ICD-10-CM

## 2018-10-24 ENCOUNTER — Ambulatory Visit
Admission: RE | Admit: 2018-10-24 | Discharge: 2018-10-24 | Disposition: A | Discharge status: Home / Self Care | Payer: 59 | Source: Ambulatory Visit

## 2018-10-24 DIAGNOSIS — Z1231 Encounter for screening mammogram for malignant neoplasm of breast: Secondary | ICD-10-CM

## 2018-10-28 ENCOUNTER — Telehealth: Payer: Self-pay | Admitting: *Deleted

## 2018-10-28 NOTE — Telephone Encounter (Signed)
Patient called has been on Junel 1-20 mg x 4 months now and noticed weight gain and low libido. Asked if you have any recommendations? Please advise

## 2018-10-28 NOTE — Telephone Encounter (Signed)
Recommend changing contraception to an IUD to avoid blocking ovulation.  Ideally Paragard IUD if desires no hormones at all, but a Progesterone IUD would still be better than a BCP for those issues and provide a lighter menstrual flow.

## 2018-10-29 ENCOUNTER — Encounter: Payer: Self-pay | Admitting: *Deleted

## 2018-10-29 NOTE — Telephone Encounter (Signed)
Sent my chart message with the below  

## 2018-10-31 MED ORDER — NORETHIN-ETH ESTRAD-FE BIPHAS 1 MG-10 MCG / 10 MCG PO TABS: 1.0000 | ORAL_TABLET | Freq: Every day | ORAL | 3 refills | Status: DC

## 2019-08-04 ENCOUNTER — Other Ambulatory Visit: Payer: Self-pay

## 2019-08-04 DIAGNOSIS — Z20822 Contact with and (suspected) exposure to covid-19: Secondary | ICD-10-CM

## 2019-08-06 LAB — NOVEL CORONAVIRUS, NAA: SARS-CoV-2, NAA: NOT DETECTED

## 2019-09-24 ENCOUNTER — Encounter: Payer: 59 | Admitting: Obstetrics & Gynecology

## 2019-10-06 ENCOUNTER — Other Ambulatory Visit: Payer: Self-pay

## 2019-10-07 ENCOUNTER — Ambulatory Visit (INDEPENDENT_AMBULATORY_CARE_PROVIDER_SITE_OTHER): Payer: 59 | Admitting: Obstetrics & Gynecology

## 2019-10-07 ENCOUNTER — Encounter: Payer: Self-pay | Admitting: Obstetrics & Gynecology

## 2019-10-07 VITALS — BP 132/80 | Ht 61.5 in | Wt 135.0 lb

## 2019-10-07 DIAGNOSIS — Z01419 Encounter for gynecological examination (general) (routine) without abnormal findings: Secondary | ICD-10-CM

## 2019-10-07 DIAGNOSIS — N841 Polyp of cervix uteri: Secondary | ICD-10-CM

## 2019-10-07 DIAGNOSIS — Z9189 Other specified personal risk factors, not elsewhere classified: Secondary | ICD-10-CM | POA: Diagnosis not present

## 2019-10-07 NOTE — Progress Notes (Signed)
Kimberly Nelson 1976-08-09 [id number]   History:    44 y.o. [id number] [id number] Married.  Sons 7 and 31 yo.  RP:  Established patient presenting for annual gyn exam   HPI:  Menses regular normal every month.  Husband Vasectomized.  No pelvic pain.  No pain with intercourse.  Breast normal.  Urine and bowel movements normal.  Stable mood with no major depressive symptoms on Zoloft.  BMI 25.09.  Physically active with kids.  Health labs with family physician.   Past medical history,surgical history, family history and social history were all reviewed and documented in the EPIC chart.  Gynecologic History Patient's last menstrual period was 09/24/2019.  Obstetric History OB History  Gravida Para Term Preterm AB Living  4 2 2  0 2 2  SAB TAB Ectopic Multiple Live Births  1 1 0 0 2    # Outcome Date GA Lbr Len/2nd Weight Sex Delivery Anes PTL Lv  4 Term 07/05/14 [redacted]w[redacted]d   M CS-LTranv Spinal  LIV  3 SAB 01/2013 [redacted]w[redacted]d         2 Term 04/30/12 [redacted]w[redacted]d 19:10 / 06:59 8 lb 15.9 oz (4.08 kg) M CS-LTranv EPI  LIV     Birth Comments: WNL  1 TAB  [redacted]w[redacted]d            ROS: A ROS was performed and pertinent positives and negatives are included in the history.  GENERAL: No fevers or chills. HEENT: No change in vision, no earache, sore throat or sinus congestion. NECK: No pain or stiffness. CARDIOVASCULAR: No chest pain or pressure. No palpitations. PULMONARY: No shortness of breath, cough or wheeze. GASTROINTESTINAL: No abdominal pain, nausea, vomiting or diarrhea, melena or bright red blood per rectum. GENITOURINARY: No urinary frequency, urgency, hesitancy or dysuria. MUSCULOSKELETAL: No joint or muscle pain, no back pain, no recent trauma. DERMATOLOGIC: No rash, no itching, no lesions. ENDOCRINE: No polyuria, polydipsia, no heat or cold intolerance. No recent change in weight. HEMATOLOGICAL: No anemia or easy bruising or bleeding. NEUROLOGIC: No headache, seizures, numbness, tingling or weakness.  PSYCHIATRIC: No depression, no loss of interest in normal activity or change in sleep pattern.     Exam:   BP 132/80   Ht 5' 1.5" (1.562 m)   Wt 135 lb (61.2 kg)   LMP 09/24/2019   BMI 25.09 kg/m   Body mass index is 25.09 kg/m.  General appearance : Well developed well nourished female. No acute distress HEENT: Eyes: no retinal hemorrhage or exudates,  Neck supple, trachea midline, no carotid bruits, no thyroidmegaly Lungs: Clear to auscultation, no rhonchi or wheezes, or rib retractions  Heart: Regular rate and rhythm, no murmurs or gallops Breast:Examined in sitting and supine position were symmetrical in appearance, no palpable masses or tenderness,  no skin retraction, no nipple inversion, no nipple discharge, no skin discoloration, no axillary or supraclavicular lymphadenopathy Abdomen: no palpable masses or tenderness, no rebound or guarding Extremities: no edema or skin discoloration or tenderness  Pelvic: Vulva: Normal             Vagina: No gross lesions or discharge  Cervix: No discharge.  Endocervical Polyp present.  Pap reflex done.  Easy removal of Endocervical Polyp.  Silver nitrate applied.  Specimen sent to Patho.  Uterus  AV, normal size, shape and consistency, non-tender and mobile  Adnexa  Without masses or tenderness  Anus: Normal   Assessment/Plan:  44 y.o. female for annual exam   1. Encounter for routine  gynecological examination with Papanicolaou smear of cervix Normal gynecologic exam. Pap reflex done. Breast exam normal. Screening mammogram February 2020 was negative. Body mass index 25.09. Continue with fitness and healthy nutrition. Health labs with family physician.  2. Relies on partner vasectomy for contraception  3. Endocervical polyp Endocervical polyp visualized on gynecologic exam. Easily removed after verbal consent. Specimen sent to pathology. Good hemostasis with silver nitrate.  Other orders - cholecalciferol (VITAMIN D3) 25 MCG (1000  UNIT) tablet; Take 1,000 Units by mouth daily.  Genia Del MD, 3:23 PM 10/07/2019

## 2019-10-09 ENCOUNTER — Encounter: Payer: Self-pay | Admitting: Obstetrics & Gynecology

## 2019-10-09 LAB — PAP IG W/ RFLX HPV ASCU

## 2019-10-09 LAB — PATHOLOGY REPORT

## 2019-10-09 LAB — TISSUE SPECIMEN

## 2019-10-09 NOTE — Patient Instructions (Signed)
1. Encounter for routine gynecological examination with Papanicolaou smear of cervix Normal gynecologic exam. Pap reflex done. Breast exam normal. Screening mammogram February 2020 was negative. Body mass index 25.09. Continue with fitness and healthy nutrition. Health labs with family physician.  2. Relies on partner vasectomy for contraception  3. Endocervical polyp Endocervical polyp visualized on gynecologic exam. Easily removed after verbal consent. Specimen sent to pathology. Good hemostasis with silver nitrate.  Other orders - cholecalciferol (VITAMIN D3) 25 MCG (1000 UNIT) tablet; Take 1,000 Units by mouth daily.  Kimberly Nelson, it was a pleasure seeing you today! I will inform you of your results as soon as they are available.

## 2019-11-10 ENCOUNTER — Other Ambulatory Visit: Payer: Self-pay | Admitting: Family Medicine

## 2019-11-10 DIAGNOSIS — Z1231 Encounter for screening mammogram for malignant neoplasm of breast: Secondary | ICD-10-CM

## 2019-12-11 ENCOUNTER — Ambulatory Visit
Admission: RE | Admit: 2019-12-11 | Discharge: 2019-12-11 | Disposition: A | Discharge status: Home / Self Care | Payer: 59 | Source: Ambulatory Visit | Attending: Family Medicine | Admitting: Family Medicine

## 2019-12-11 ENCOUNTER — Other Ambulatory Visit: Payer: Self-pay

## 2019-12-11 DIAGNOSIS — Z1231 Encounter for screening mammogram for malignant neoplasm of breast: Secondary | ICD-10-CM

## 2020-03-02 IMAGING — MG DIGITAL SCREENING BILATERAL MAMMOGRAM WITH TOMO AND CAD
8 series · 9 of 24 positions shown · non-contrast
Comparison: None.

CLINICAL DATA: Screening.

EXAM:
DIGITAL SCREENING BILATERAL MAMMOGRAM WITH TOMO AND CAD

[L MLO synth-2D]
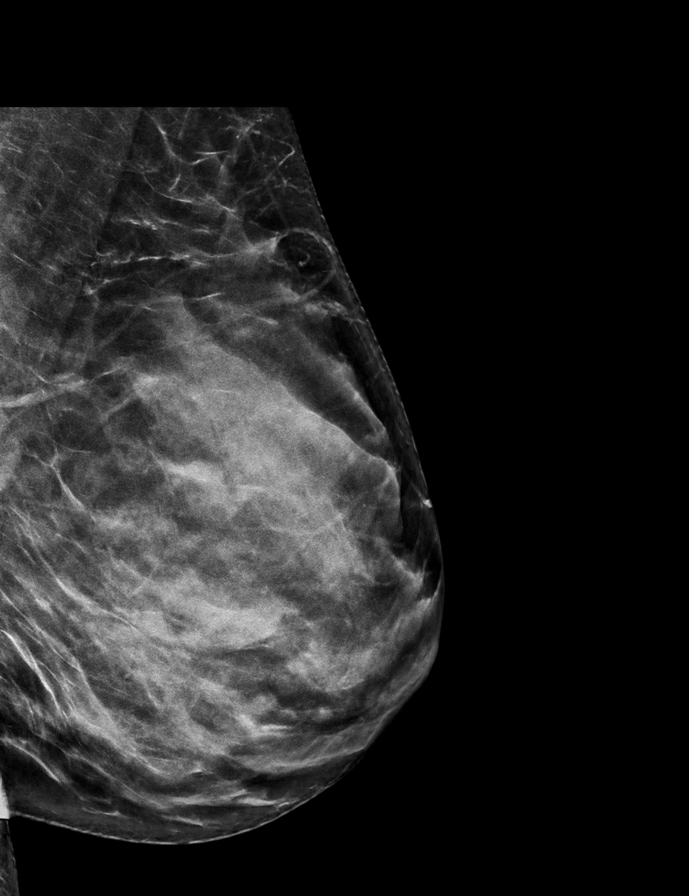

[L CC synth-2D]
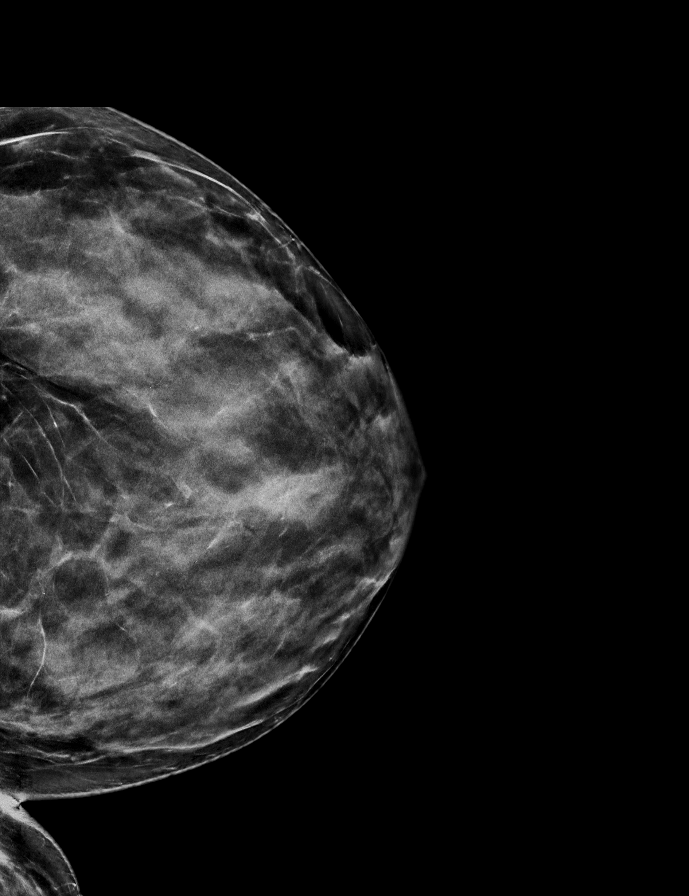

[R CC synth-2D]
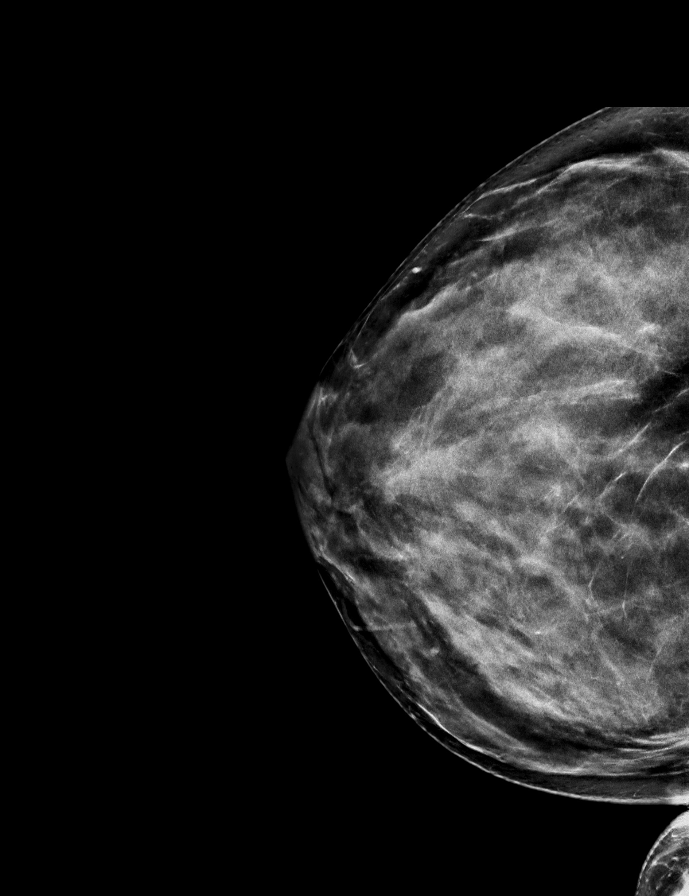

[R MLO synth-2D]
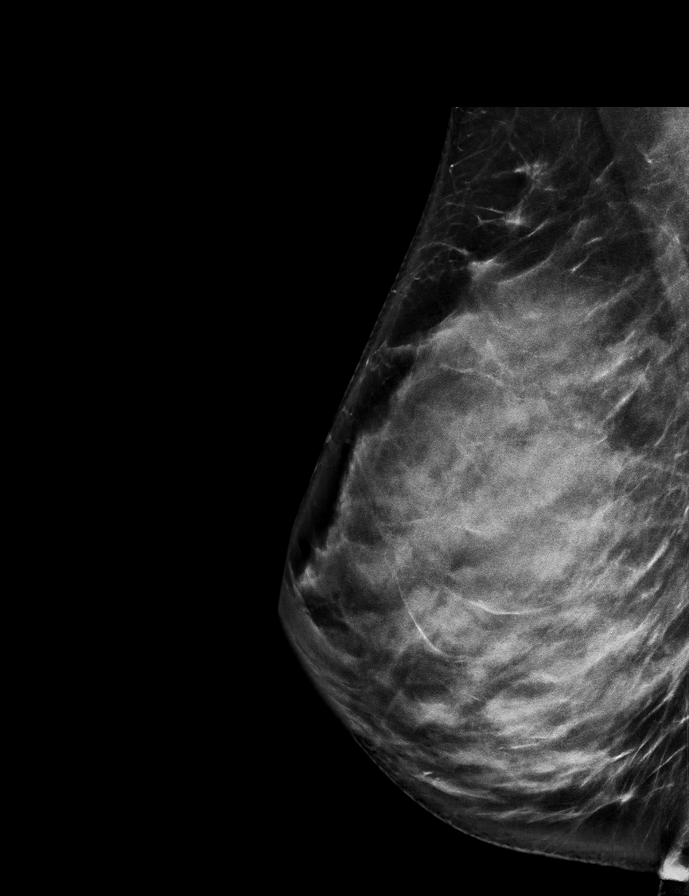

[R CC tomo · 2 of 73 frames shown]
[frame 24/73]
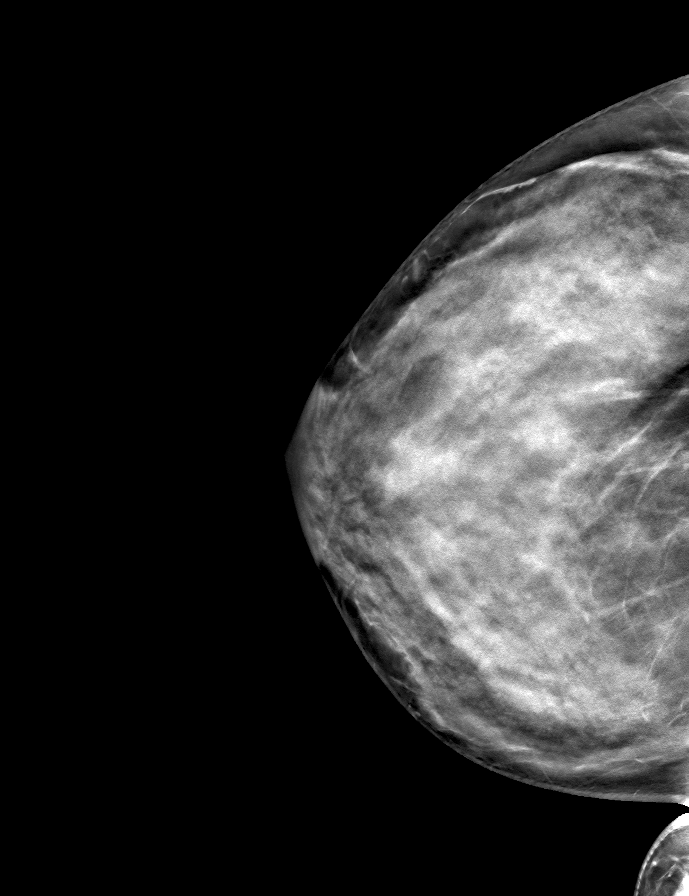
[frame 37/73]
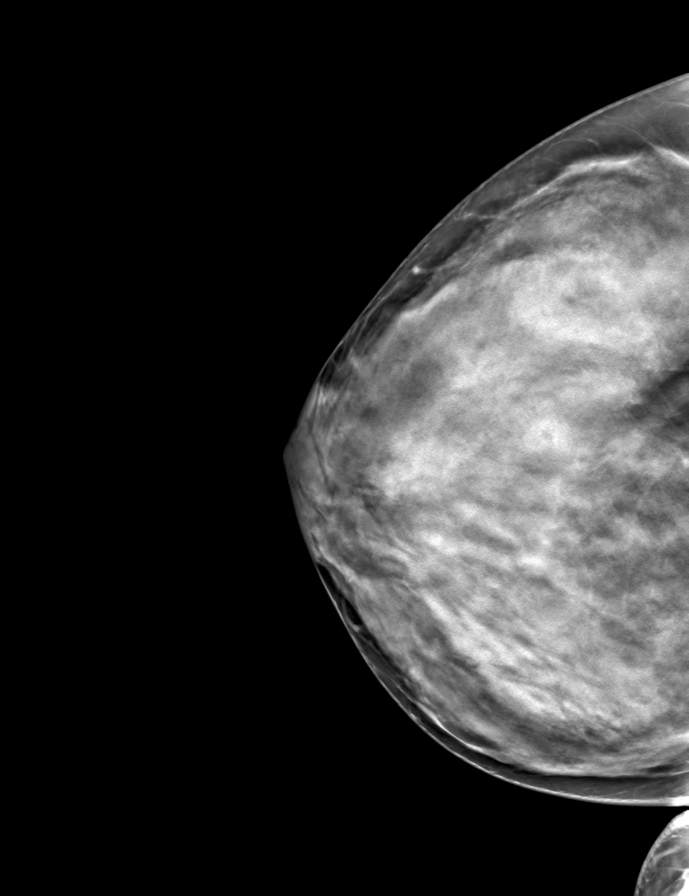

[L CC tomo · tomo slice 35/68.0]
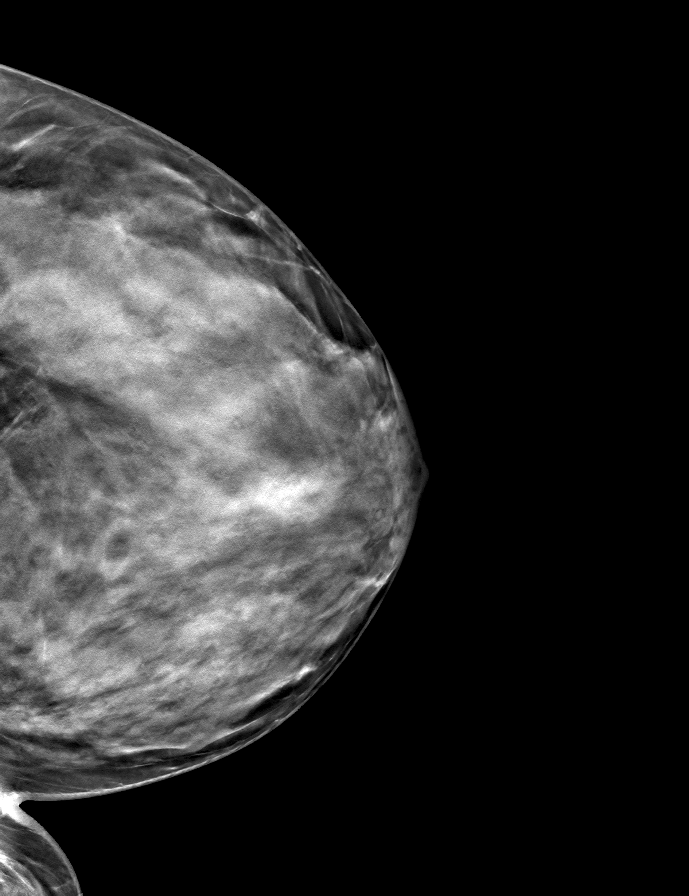

[R MLO tomo · tomo slice 37/74.0]
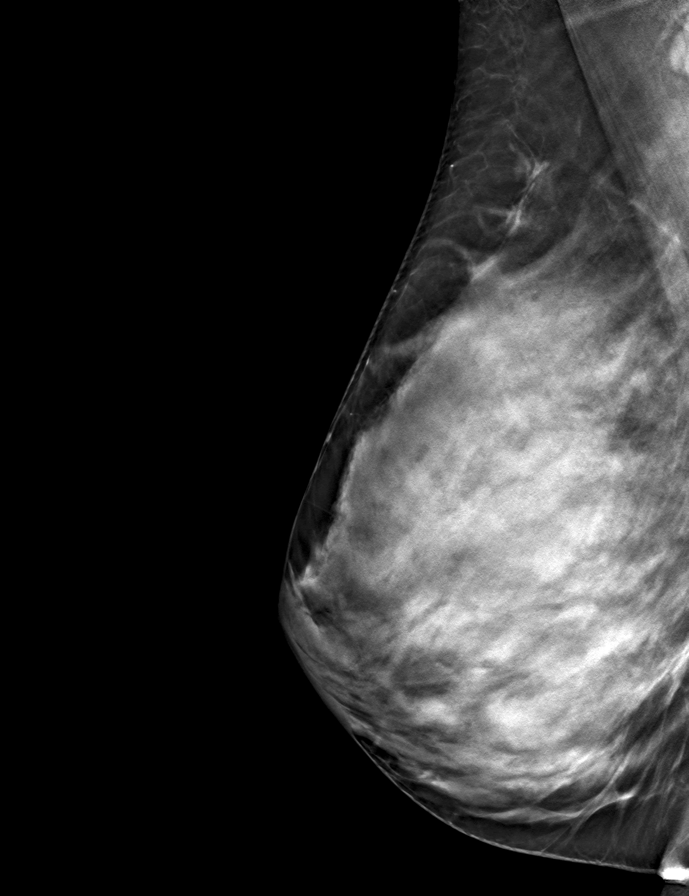

[L MLO tomo · tomo slice 35/69.0]
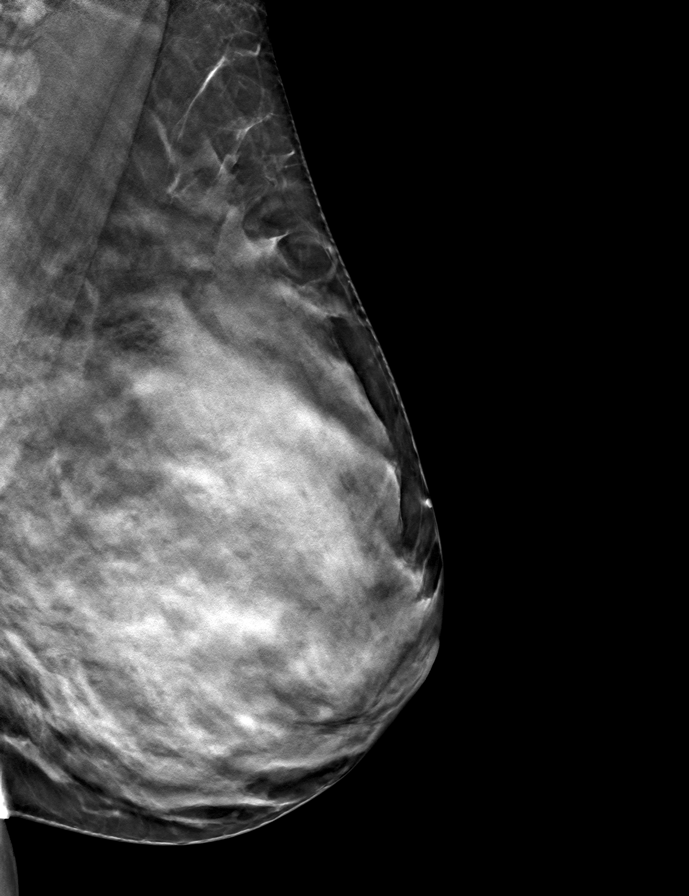

[9 of 24 positions shown; findings below may reference images not displayed]

ACR Breast Density Category d: The breast tissue is extremely dense,
which lowers the sensitivity of mammography.
FINDINGS: There are no findings suspicious for malignancy. Images were
processed with CAD.
IMPRESSION: No mammographic evidence of malignancy. A result letter of this
screening mammogram will be mailed directly to the patient.

RECOMMENDATION:
Screening mammogram in one year. (Code:F8-6-TBV)

BI-RADS CATEGORY  1: Negative.

## 2020-10-07 ENCOUNTER — Ambulatory Visit (INDEPENDENT_AMBULATORY_CARE_PROVIDER_SITE_OTHER): Payer: 59 | Admitting: Obstetrics & Gynecology

## 2020-10-07 ENCOUNTER — Other Ambulatory Visit: Payer: Self-pay

## 2020-10-07 ENCOUNTER — Encounter: Payer: Self-pay | Admitting: Obstetrics & Gynecology

## 2020-10-07 VITALS — BP 126/70 | HR 72 | Ht 61.0 in | Wt 137.0 lb

## 2020-10-07 DIAGNOSIS — Z01419 Encounter for gynecological examination (general) (routine) without abnormal findings: Secondary | ICD-10-CM | POA: Diagnosis not present

## 2020-10-07 DIAGNOSIS — N898 Other specified noninflammatory disorders of vagina: Secondary | ICD-10-CM | POA: Diagnosis not present

## 2020-10-07 DIAGNOSIS — Z9189 Other specified personal risk factors, not elsewhere classified: Secondary | ICD-10-CM | POA: Diagnosis not present

## 2020-10-07 LAB — WET PREP FOR TRICH, YEAST, CLUE

## 2020-10-07 MED ORDER — FLUCONAZOLE 150 MG PO TABS: 150.0000 mg | ORAL_TABLET | Freq: Every day | ORAL | 1 refills | Status: AC

## 2020-10-07 NOTE — Progress Notes (Signed)
Kimberly Nelson July 22, 1976 [id number]   History:    45 y.o. [id number] Married.Sons 8 and 6 yo.  FT:[id number] presenting for annual gyn exam   HPI:  Menses regular normal every month.  Husband Vasectomized. No pelvic pain. No pain with intercourse.  Mild vaginal itching. Breast normal. Urine and bowel movements normal. Stable mood with no major depressive symptoms on Zoloft.  BMI 25.89.  Physically active with kids. Health labs with family physician.  Past medical history,surgical history, family history and social history were all reviewed and documented in the EPIC chart.  Gynecologic History Patient's last menstrual period was 09/20/2020 (within days).  Obstetric History OB History  Gravida Para Term Preterm AB Living  4 2 2  0 2 2  SAB IAB Ectopic Multiple Live Births  1 1 0 0 2    # Outcome Date GA Lbr Len/2nd Weight Sex Delivery Anes PTL Lv  4 Term 07/05/14 [redacted]w[redacted]d   M CS-LTranv Spinal  LIV  3 SAB 01/2013 [redacted]w[redacted]d         2 Term 04/30/12 [redacted]w[redacted]d 19:10 / 06:59 8 lb 15.9 oz (4.08 kg) M CS-LTranv EPI  LIV     Birth Comments: WNL  1 IAB  [redacted]w[redacted]d            ROS: A ROS was performed and pertinent positives and negatives are included in the history.  GENERAL: No fevers or chills. HEENT: No change in vision, no earache, sore throat or sinus congestion. NECK: No pain or stiffness. CARDIOVASCULAR: No chest pain or pressure. No palpitations. PULMONARY: No shortness of breath, cough or wheeze. GASTROINTESTINAL: No abdominal pain, nausea, vomiting or diarrhea, melena or bright red blood per rectum. GENITOURINARY: No urinary frequency, urgency, hesitancy or dysuria. MUSCULOSKELETAL: No joint or muscle pain, no back pain, no recent trauma. DERMATOLOGIC: No rash, no itching, no lesions. ENDOCRINE: No polyuria, polydipsia, no heat or cold intolerance. No recent change in weight. HEMATOLOGICAL: No anemia or easy bruising or bleeding. NEUROLOGIC: No headache, seizures,  numbness, tingling or weakness. PSYCHIATRIC: No depression, no loss of interest in normal activity or change in sleep pattern.     Exam:   BP 126/70 (BP Location: Right Arm, Patient Position: Sitting, Cuff Size: Normal)   Pulse 72   Ht 5\' 1"  (1.549 m)   Wt 137 lb (62.1 kg)   LMP 09/20/2020 (Within Days)   BMI 25.89 kg/m   Body mass index is 25.89 kg/m.  General appearance : Well developed well nourished female. No acute distress HEENT: Eyes: no retinal hemorrhage or exudates,  Neck supple, trachea midline, no carotid bruits, no thyroidmegaly Lungs: Clear to auscultation, no rhonchi or wheezes, or rib retractions  Heart: Regular rate and rhythm, no murmurs or gallops Breast:Examined in sitting and supine position were symmetrical in appearance, no palpable masses or tenderness,  no skin retraction, no nipple inversion, no nipple discharge, no skin discoloration, no axillary or supraclavicular lymphadenopathy Abdomen: no palpable masses or tenderness, no rebound or guarding Extremities: no edema or skin discoloration or tenderness  Pelvic: Vulva: Normal             Vagina: No gross lesions.  Mild increase in discharge.  Wet prep done.  Cervix: No gross lesions or discharge  Uterus  AV, normal size, shape and consistency, non-tender and mobile  Adnexa  Without masses or tenderness  Anus: Normal  Wet prep:  Yeasts present   Assessment/Plan:  45 y.o. female for annual exam   1.  Well female exam with routine gynecological exam Normal gynecologic exam.  Pap test was negative in January 2021, no indication to repeat this year.  Breast exam normal.  Screening mammogram March 2021 was negative.  Body mass index 25.89.  Continue with fitness and healthy nutrition.  Health labs with family physician.  2. Relies on partner vasectomy for contraception  3. Itching in the vaginal area Yeast vaginitis confirmed by wet prep.  Will treat with fluconazole 1 tablet per mouth daily for 3 days.   Usage reviewed and prescription sent to pharmacy. - WET PREP FOR TRICH, YEAST, CLUE  Other orders - Multiple Vitamin (MULTIVITAMIN) tablet; Take 1 tablet by mouth daily. - fluconazole (DIFLUCAN) 150 MG tablet; Take 1 tablet (150 mg total) by mouth daily for 3 days. Genia Del MD, 4:00 PM 10/07/2020

## 2020-10-13 ENCOUNTER — Encounter: Payer: Self-pay | Admitting: Obstetrics & Gynecology

## 2020-11-09 ENCOUNTER — Other Ambulatory Visit: Payer: Self-pay | Admitting: Obstetrics & Gynecology

## 2020-11-09 DIAGNOSIS — Z1231 Encounter for screening mammogram for malignant neoplasm of breast: Secondary | ICD-10-CM

## 2020-12-16 ENCOUNTER — Ambulatory Visit
Admission: RE | Admit: 2020-12-16 | Discharge: 2020-12-16 | Disposition: A | Discharge status: Home / Self Care | Payer: No Typology Code available for payment source | Source: Ambulatory Visit

## 2020-12-16 ENCOUNTER — Other Ambulatory Visit: Payer: Self-pay

## 2020-12-16 DIAGNOSIS — Z1231 Encounter for screening mammogram for malignant neoplasm of breast: Secondary | ICD-10-CM

## 2021-10-13 ENCOUNTER — Ambulatory Visit: Payer: 59 | Admitting: Obstetrics & Gynecology

## 2022-05-22 ENCOUNTER — Other Ambulatory Visit: Payer: Self-pay | Admitting: Family Medicine

## 2022-05-22 DIAGNOSIS — Z1231 Encounter for screening mammogram for malignant neoplasm of breast: Secondary | ICD-10-CM

## 2022-05-24 ENCOUNTER — Ambulatory Visit: Payer: BC Managed Care – PPO | Admitting: Obstetrics & Gynecology

## 2022-05-31 ENCOUNTER — Ambulatory Visit: Payer: No Typology Code available for payment source

## 2022-06-11 ENCOUNTER — Ambulatory Visit (INDEPENDENT_AMBULATORY_CARE_PROVIDER_SITE_OTHER): Payer: No Typology Code available for payment source | Admitting: Obstetrics & Gynecology

## 2022-06-11 ENCOUNTER — Other Ambulatory Visit (HOSPITAL_COMMUNITY)
Admission: RE | Admit: 2022-06-11 | Discharge: 2022-06-11 | Disposition: A | Discharge status: Home / Self Care | Payer: Self-pay | Source: Ambulatory Visit | Attending: Obstetrics & Gynecology | Admitting: Obstetrics & Gynecology

## 2022-06-11 ENCOUNTER — Encounter: Payer: Self-pay | Admitting: Obstetrics & Gynecology

## 2022-06-11 VITALS — BP 122/68 | HR 72 | Ht 61.5 in | Wt 129.0 lb

## 2022-06-11 DIAGNOSIS — Z01419 Encounter for gynecological examination (general) (routine) without abnormal findings: Secondary | ICD-10-CM | POA: Insufficient documentation

## 2022-06-11 DIAGNOSIS — Z9189 Other specified personal risk factors, not elsewhere classified: Secondary | ICD-10-CM

## 2022-06-11 NOTE — Progress Notes (Signed)
Kimberly Nelson [birth year] [id number]   History:    46 y.o.  [id number] [id number] Married.  Sons 8 and 6 yo.   RP:  Established patient presenting for annual gyn exam    HPI:  Menses regular normal every month. Dysmenorrhea increased.  Recommend Ibuprofen.  No BTB.  Some H/A's and night sweats occasionally.  Will start a calendar of Sxs.  Husband Vasectomized.  No pelvic pain.  No pain with intercourse. Pap Neg in 09/2019.  No h/o abnormal Pap.  Pap reflex today.  Breasts normal.  Will schedule mammo now.  Urine normal. Tendency for constipation on new Anti-Depressants, Effexor XR and Wellbutrin XL controlling her depression better.  Health labs with family physician.   Past medical history,surgical history, family history and social history were all reviewed and documented in the EPIC chart.  Gynecologic History Patient's last menstrual period was 06/01/2022.  Obstetric History OB History  Gravida Para Term Preterm AB Living  4 2 2  0 2 2  SAB IAB Ectopic Multiple Live Births  1 1 0 0 2    # Outcome Date GA Lbr Len/2nd Weight Sex Delivery Anes PTL Lv  4 Term 07/05/14 [redacted]w[redacted]d   M CS-LTranv Spinal  LIV  3 SAB 01/2013 [redacted]w[redacted]d         2 Term 04/30/12 [redacted]w[redacted]d 19:10 / 06:59 8 lb 15.9 oz (4.08 kg) M CS-LTranv EPI  LIV     Birth Comments: WNL  1 IAB  [redacted]w[redacted]d            ROS: A ROS was performed and pertinent positives and negatives are included in the history. GENERAL: No fevers or chills. HEENT: No change in vision, no earache, sore throat or sinus congestion. NECK: No pain or stiffness. CARDIOVASCULAR: No chest pain or pressure. No palpitations. PULMONARY: No shortness of breath, cough or wheeze. GASTROINTESTINAL: No abdominal pain, nausea, vomiting or diarrhea, melena or bright red blood per rectum. GENITOURINARY: No urinary frequency, urgency, hesitancy or dysuria. MUSCULOSKELETAL: No joint or muscle pain, no back pain, no recent trauma. DERMATOLOGIC: No rash, no itching, no lesions. ENDOCRINE: No  polyuria, polydipsia, no heat or cold intolerance. No recent change in weight. HEMATOLOGICAL: No anemia or easy bruising or bleeding. NEUROLOGIC: No headache, seizures, numbness, tingling or weakness. PSYCHIATRIC: No depression, no loss of interest in normal activity or change in sleep pattern.     Exam:   BP 122/68   Pulse 72   Ht 5' 1.5" (1.562 m)   Wt 129 lb (58.5 kg)   LMP 06/01/2022   SpO2 100%   BMI 23.98 kg/m   Body mass index is 23.98 kg/m.  General appearance : Well developed well nourished female. No acute distress HEENT: Eyes: no retinal hemorrhage or exudates,  Neck supple, trachea midline, no carotid bruits, no thyroidmegaly Lungs: Clear to auscultation, no rhonchi or wheezes, or rib retractions  Heart: Regular rate and rhythm, no murmurs or gallops Breast:Examined in sitting and supine position were symmetrical in appearance, no palpable masses or tenderness,  no skin retraction, no nipple inversion, no nipple discharge, no skin discoloration, no axillary or supraclavicular lymphadenopathy Abdomen: no palpable masses or tenderness, no rebound or guarding Extremities: no edema or skin discoloration or tenderness  Pelvic: Vulva: Normal             Vagina: No gross lesions or discharge  Cervix: No gross lesions or discharge.  Pap reflex done.  Uterus  AV, normal size, shape and consistency, non-tender  and mobile  Adnexa  Without masses or tenderness  Anus: Normal   Assessment/Plan:  46 y.o. female for annual exam   1. Encounter for routine gynecological examination with Papanicolaou smear of cervix Menses regular normal every month. Dysmenorrhea increased.  Recommend Ibuprofen.  No BTB.  Some H/A's and night sweats occasionally.  Will start a calendar of Sxs to see if it is part of PMS. Husband Vasectomized.  No pelvic pain.  No pain with intercourse. Pap Neg in 09/2019.  No h/o abnormal Pap.  Pap reflex today.  Breasts normal.  Will schedule mammo now.  Urine normal.  Tendency for constipation on new Anti-Depressants, Effexor XR and Wellbutrin XL controlling her depression better.  Health labs with family physician. - Cytology - PAP( [place])  2. Relies on partner vasectomy for contraception  Other orders - buPROPion (WELLBUTRIN XL) 150 MG 24 hr tablet; Take 150 mg by mouth every morning. - venlafaxine XR (EFFEXOR-XR) 75 MG 24 hr capsule; Take 225 mg by mouth daily.   Princess Bruins MD, 4:54 PM 06/11/2022

## 2022-06-15 LAB — CYTOLOGY - PAP: Diagnosis: NEGATIVE

## 2024-03-17 NOTE — Progress Notes (Signed)
 [facility] [place]  Return Visit Kimberly Nelson DOB: April 25, 1976  MRN: [id number] Visit Date: 03/17/2024  Encounter Provider: Bernardino JAYSON Level, FNP  Subjective:   Kimberly Nelson is a 48 y.o. female presenting for follow-up.  HYPERTENSION MANAGEMENT: She is here for hypertension follow-up.  States that he still has some fatigue - intermittent headaches have resolved. SYMPTOMS: Pertinent negatives - chest pain, chest pressure/discomfort, claudication, dyspnea, exertional chest pressure/discomfort, fatigue, irregular heart beat, lower extremity edema, near-syncope, orthopnea, palpitations, paroxysmal nocturnal dyspnea, syncope, and tachypnea HABITS: --Patient has not been following a reduced sodium diet. -- She IS NOT getting adequate exercise. HOME BLOOD PRESSURE: Checking at home, but not consistently MEDICATIONS: Amlodipine  5 mg daily and Losartan  50 mg RISK FACTORS: Smoking: None Exercise: Minimal      BP Readings from Last 3 Encounters:  03/17/24 129/85  12/16/23 (!) 131/91  09/03/23 (!) 151/109     ANXIETY AND DEPRESSION: Currently takes Effexor  75 mg daily and Wellbutrin  XL 300 mg daily (prescribed by psychiatrist) Reports having generalized anxiety - triggers are husband and kids Present since she was a teen Has not had an anxiety attack in several years Denies SI/HI Also uses Phentermine 15 mg for weight loss   Taking Magnesium  Glycinate at nighttime.  MILD SHAKES OF BILATERAL HANDS AND JITTERY:  - Happening for about 1 month since starting phentermine  - Family members noticed it first - Drinks 1.5 cups of coffee daily and 1 small can of soda daily - Denies family history of tremors - Happens randomly/intermittently - Believes it is the phentermine and will discuss with Psych regarding holding this  HISTORY OF VITAMIN D DEFICIENCY: - Feels more fatigued - Wants levels checked today, as well as B12  Wt Readings from Last 3 Encounters:   03/17/24 63 kg (139 lb)  12/16/23 64.3 kg (141 lb 12.8 oz)  09/03/23 61.2 kg (135 lb)      Review of Systems  Constitutional:  Negative for activity change, appetite change, chills, diaphoresis, fatigue and fever.  HENT:  Negative for congestion, ear pain, postnasal drip, rhinorrhea, sinus pressure, sinus pain, sneezing, sore throat and tinnitus.   Eyes:  Negative for photophobia, pain and redness.  Respiratory:  Negative for cough, chest tightness, shortness of breath and wheezing.   Cardiovascular:  Negative for chest pain, palpitations and leg swelling.  Gastrointestinal:  Negative for abdominal distention, abdominal pain, constipation, diarrhea, nausea and vomiting.  Endocrine: Negative for polydipsia, polyphagia and polyuria.  Genitourinary:  Negative for difficulty urinating, flank pain and frequency.  Musculoskeletal:  Negative for arthralgias, back pain, gait problem, joint swelling, neck pain and neck stiffness.  Skin:  Negative for color change, pallor and rash.  Allergic/Immunologic: Negative for environmental allergies, food allergies and immunocompromised state.  Neurological:  Positive for tremors. Negative for dizziness, light-headedness and headaches.  Hematological:  Negative for adenopathy. Does not bruise/bleed easily.  Psychiatric/Behavioral:  Positive for dysphoric mood. Negative for agitation. The patient is nervous/anxious.      Objective:  BP 129/85   Pulse 96   Temp 99 F (37.2 C)   Resp 14   Ht 1.562 m (5' 1.5)   Wt 63 kg (139 lb)   LMP 02/24/2024 (Exact Date)   SpO2 99%   BMI 25.84 kg/m   Physical Exam Vitals and nursing note reviewed.  Constitutional:      Appearance: She is normal weight.  HENT:     Head: Normocephalic and atraumatic.   Cardiovascular:  Rate and Rhythm: Normal rate and regular rhythm.     Pulses: Normal pulses.     Heart sounds: Normal heart sounds.  Pulmonary:     Effort: Pulmonary effort is normal.     Breath  sounds: Normal breath sounds.  Abdominal:     General: Abdomen is flat. Bowel sounds are normal.     Palpations: Abdomen is soft.   Musculoskeletal:        General: Normal range of motion.     Cervical back: Normal range of motion and neck supple.   Skin:    General: Skin is warm and dry.   Neurological:     General: No focal deficit present.     Mental Status: She is alert and oriented to person, place, and time. Mental status is at baseline.     Motor: Tremor (mild - bilateral hands) present.   Psychiatric:        Mood and Affect: Mood normal.        Behavior: Behavior normal.        Thought Content: Thought content normal.        Judgment: Judgment normal.     Labs: No results found for this or any previous visit (from the past week).   Assessment/Plan:   1. Essential hypertension (Primary) BP stable - continue current regimen.  Due for BMP today - Basic Metabolic Panel  2. Anxiety and depression Stable - continue current regimen and follow with Psych  3. Tremor Discuss with Psych regarding holding the phentermine to see if these improve  4. History of vitamin D deficiency Due for BW - drawn today - Vitamin D, 25-Hydroxy - Vitamin B12   Problem List Items Addressed This Visit   None Visit Diagnoses       Essential hypertension    -  Primary   Relevant Orders   Basic Metabolic Panel     Anxiety and depression       Relevant Medications   phentermine 15 mg cap     Tremor         History of vitamin D deficiency       Relevant Orders   Vitamin D, 25-Hydroxy   Vitamin B12      RTC in 3 months for F/U  No follow-ups on file.   There are no Patient Instructions on file for this visit.   Please contact my office for worsening conditions or problems, and seek emergency medical treatment and/or call 911 if you or your family deems either necessary.  I have personally spent 30 minutes involved in face-to-face and non-face-to-face activities for this  patient on the day of the visit.  Professional time spent includes the following activities, in addition to those noted in the documentation:  - preparing to see the patient (e.g., review of recent and/or remote lab/imaging/study results, provider notes, and patient messages/phone calls available in current EMR, CareEverywhere, and scanned records) -obtaining and/or reviewing separately obtained history either through past provider notes, patient phone calls, and/or patient's family member(s)/caregiver(s) -performing a medically appropriate examination and/or evaluation -counseling and educating the patient/family/caregiver -ordering medications, tests, or procedures -documenting clinical information in the electronic or other health record -reviewing most up to date studies or expert consensus guidelines for screening/diagnosing/treating pertinent conditions/symptoms -independently interpreting results (not separately reported) and communicating results to the patient/family/caregiver -care coordination (not separately reported) -referring and communicating with other health care professionals (when not separately reported)   This document was created with the  assistance of Conservation officer, historic buildings.  Electronically signed by: Bernardino JAYSON Level, FNP 03/17/2024 4:57 PM

## 2024-05-02 ENCOUNTER — Encounter (HOSPITAL_BASED_OUTPATIENT_CLINIC_OR_DEPARTMENT_OTHER): Payer: Self-pay

## 2024-05-02 ENCOUNTER — Inpatient Hospital Stay (HOSPITAL_BASED_OUTPATIENT_CLINIC_OR_DEPARTMENT_OTHER)
Admission: EM | Admit: 2024-05-02 | Discharge: 2024-05-05 | DRG: 418 | Disposition: A | Discharge status: Home / Self Care | Attending: Internal Medicine | Admitting: Internal Medicine

## 2024-05-02 ENCOUNTER — Other Ambulatory Visit: Payer: Self-pay

## 2024-05-02 ENCOUNTER — Emergency Department (HOSPITAL_BASED_OUTPATIENT_CLINIC_OR_DEPARTMENT_OTHER)

## 2024-05-02 DIAGNOSIS — K851 Biliary acute pancreatitis without necrosis or infection: Principal | ICD-10-CM | POA: Diagnosis present

## 2024-05-02 DIAGNOSIS — F419 Anxiety disorder, unspecified: Secondary | ICD-10-CM | POA: Diagnosis present

## 2024-05-02 DIAGNOSIS — K219 Gastro-esophageal reflux disease without esophagitis: Secondary | ICD-10-CM | POA: Diagnosis present

## 2024-05-02 DIAGNOSIS — I10 Essential (primary) hypertension: Secondary | ICD-10-CM | POA: Diagnosis present

## 2024-05-02 DIAGNOSIS — Z882 Allergy status to sulfonamides status: Secondary | ICD-10-CM

## 2024-05-02 DIAGNOSIS — K529 Noninfective gastroenteritis and colitis, unspecified: Secondary | ICD-10-CM | POA: Diagnosis present

## 2024-05-02 DIAGNOSIS — K5909 Other constipation: Secondary | ICD-10-CM | POA: Diagnosis present

## 2024-05-02 DIAGNOSIS — Z880 Allergy status to penicillin: Secondary | ICD-10-CM | POA: Diagnosis not present

## 2024-05-02 DIAGNOSIS — Z98891 History of uterine scar from previous surgery: Secondary | ICD-10-CM

## 2024-05-02 DIAGNOSIS — Z87891 Personal history of nicotine dependence: Secondary | ICD-10-CM | POA: Diagnosis not present

## 2024-05-02 DIAGNOSIS — Z6827 Body mass index (BMI) 27.0-27.9, adult: Secondary | ICD-10-CM | POA: Diagnosis not present

## 2024-05-02 DIAGNOSIS — Z8249 Family history of ischemic heart disease and other diseases of the circulatory system: Secondary | ICD-10-CM | POA: Diagnosis not present

## 2024-05-02 DIAGNOSIS — K859 Acute pancreatitis without necrosis or infection, unspecified: Principal | ICD-10-CM | POA: Diagnosis present

## 2024-05-02 DIAGNOSIS — K8010 Calculus of gallbladder with chronic cholecystitis without obstruction: Secondary | ICD-10-CM | POA: Diagnosis not present

## 2024-05-02 DIAGNOSIS — F32A Depression, unspecified: Secondary | ICD-10-CM | POA: Diagnosis present

## 2024-05-02 DIAGNOSIS — R1010 Upper abdominal pain, unspecified: Secondary | ICD-10-CM | POA: Diagnosis present

## 2024-05-02 DIAGNOSIS — E663 Overweight: Secondary | ICD-10-CM | POA: Diagnosis present

## 2024-05-02 DIAGNOSIS — Z79899 Other long term (current) drug therapy: Secondary | ICD-10-CM

## 2024-05-02 DIAGNOSIS — K8000 Calculus of gallbladder with acute cholecystitis without obstruction: Secondary | ICD-10-CM | POA: Diagnosis present

## 2024-05-02 DIAGNOSIS — Z87440 Personal history of urinary (tract) infections: Secondary | ICD-10-CM | POA: Diagnosis not present

## 2024-05-02 DIAGNOSIS — Z833 Family history of diabetes mellitus: Secondary | ICD-10-CM | POA: Diagnosis not present

## 2024-05-02 LAB — URINALYSIS, ROUTINE W REFLEX MICROSCOPIC
Bilirubin Urine: NEGATIVE
Glucose, UA: NEGATIVE mg/dL
Hgb urine dipstick: NEGATIVE
Ketones, ur: NEGATIVE mg/dL
Leukocytes,Ua: NEGATIVE
Nitrite: NEGATIVE
Protein, ur: NEGATIVE mg/dL
Specific Gravity, Urine: 1.01 (ref 1.005–1.030)
pH: 7.5 (ref 5.0–8.0)

## 2024-05-02 LAB — PREGNANCY, URINE: Preg Test, Ur: NEGATIVE

## 2024-05-02 LAB — CBC
HCT: 37.9 % (ref 36.0–46.0)
Hemoglobin: 13.2 g/dL (ref 12.0–15.0)
MCH: 31.9 pg (ref 26.0–34.0)
MCHC: 34.8 g/dL (ref 30.0–36.0)
MCV: 91.5 fL (ref 80.0–100.0)
Platelets: 312 K/uL (ref 150–400)
RBC: 4.14 MIL/uL (ref 3.87–5.11)
RDW: 13.6 % (ref 11.5–15.5)
WBC: 14 K/uL — ABNORMAL HIGH (ref 4.0–10.5)
nRBC: 0 % (ref 0.0–0.2)

## 2024-05-02 LAB — COMPREHENSIVE METABOLIC PANEL WITH GFR
ALT: 203 U/L — ABNORMAL HIGH (ref 0–44)
AST: 412 U/L — ABNORMAL HIGH (ref 15–41)
Albumin: 4.8 g/dL (ref 3.5–5.0)
Alkaline Phosphatase: 74 U/L (ref 38–126)
Anion gap: 12 (ref 5–15)
BUN: 14 mg/dL (ref 6–20)
CO2: 25 mmol/L (ref 22–32)
Calcium: 9.5 mg/dL (ref 8.9–10.3)
Chloride: 102 mmol/L (ref 98–111)
Creatinine, Ser: 0.83 mg/dL (ref 0.44–1.00)
GFR, Estimated: >60 mL/min (ref >60)
Glucose, Bld: 143 mg/dL — ABNORMAL HIGH (ref 70–99)
Potassium: 4 mmol/L (ref 3.5–5.1)
Sodium: 138 mmol/L (ref 135–145)
Total Bilirubin: 0.8 mg/dL (ref 0.0–1.2)
Total Protein: 7.6 g/dL (ref 6.5–8.1)

## 2024-05-02 LAB — LIPASE, BLOOD: Lipase: >2800 U/L — ABNORMAL HIGH (ref 11–51)

## 2024-05-02 MED ORDER — LOSARTAN POTASSIUM 50 MG PO TABS
50.0000 mg | ORAL_TABLET | Freq: Every day | ORAL | Status: DC
  Administered 2024-05-02: 50 mg via ORAL
  Filled 2024-05-02 (×2): qty 1

## 2024-05-02 MED ORDER — LORATADINE 10 MG PO TABS
10.0000 mg | ORAL_TABLET | Freq: Every day | ORAL | Status: DC
  Administered 2024-05-02 – 2024-05-04 (×3): 10 mg via ORAL
  Filled 2024-05-02 (×3): qty 1

## 2024-05-02 MED ORDER — SODIUM CHLORIDE 0.9 % IV BOLUS
1000.0000 mL | Freq: Once | INTRAVENOUS | Status: AC
  Administered 2024-05-02: 1000 mL via INTRAVENOUS

## 2024-05-02 MED ORDER — OXYCODONE HCL 5 MG PO TABS: 5.0000 mg | ORAL_TABLET | ORAL | Status: DC | PRN

## 2024-05-02 MED ORDER — AMLODIPINE BESYLATE 5 MG PO TABS
5.0000 mg | ORAL_TABLET | Freq: Every day | ORAL | Status: DC
  Administered 2024-05-02: 5 mg via ORAL
  Filled 2024-05-02 (×2): qty 1

## 2024-05-02 MED ORDER — ONDANSETRON HCL 4 MG/2ML IJ SOLN
4.0000 mg | Freq: Once | INTRAMUSCULAR | Status: AC
  Administered 2024-05-02: 4 mg via INTRAVENOUS
  Filled 2024-05-02: qty 2

## 2024-05-02 MED ORDER — SODIUM CHLORIDE 0.9 % IV SOLN: Freq: Once | INTRAVENOUS | Status: AC

## 2024-05-02 MED ORDER — TRAZODONE HCL 50 MG PO TABS: 25.0000 mg | ORAL_TABLET | Freq: Every evening | ORAL | Status: DC | PRN

## 2024-05-02 MED ORDER — MORPHINE SULFATE (PF) 2 MG/ML IV SOLN
2.0000 mg | Freq: Once | INTRAVENOUS | Status: AC
  Administered 2024-05-02: 2 mg via INTRAVENOUS
  Filled 2024-05-02: qty 1

## 2024-05-02 MED ORDER — BUPROPION HCL ER (XL) 300 MG PO TB24
300.0000 mg | ORAL_TABLET | Freq: Every day | ORAL | Status: DC
  Administered 2024-05-02 – 2024-05-05 (×4): 300 mg via ORAL
  Filled 2024-05-02 (×4): qty 1

## 2024-05-02 MED ORDER — ALBUTEROL SULFATE (2.5 MG/3ML) 0.083% IN NEBU: 2.5000 mg | INHALATION_SOLUTION | RESPIRATORY_TRACT | Status: DC | PRN

## 2024-05-02 MED ORDER — ENOXAPARIN SODIUM 40 MG/0.4ML IJ SOSY
40.0000 mg | PREFILLED_SYRINGE | Freq: Every day | INTRAMUSCULAR | Status: DC
  Administered 2024-05-02 – 2024-05-04 (×3): 40 mg via SUBCUTANEOUS
  Filled 2024-05-02 (×3): qty 0.4

## 2024-05-02 MED ORDER — MAGNESIUM GLUCONATE 500 (27 MG) MG PO TABS
500.0000 mg | ORAL_TABLET | Freq: Every day | ORAL | Status: DC
  Administered 2024-05-02 – 2024-05-05 (×4): 500 mg via ORAL
  Filled 2024-05-02 (×4): qty 1

## 2024-05-02 MED ORDER — HYDROMORPHONE HCL 1 MG/ML IJ SOLN
1.0000 mg | INTRAMUSCULAR | Status: DC | PRN
  Administered 2024-05-02 (×2): 1 mg via INTRAVENOUS
  Filled 2024-05-02 (×2): qty 1

## 2024-05-02 MED ORDER — HYDROMORPHONE HCL 1 MG/ML IJ SOLN
1.0000 mg | Freq: Once | INTRAMUSCULAR | Status: AC
  Administered 2024-05-02: 1 mg via INTRAVENOUS
  Filled 2024-05-02: qty 1

## 2024-05-02 MED ORDER — SODIUM CHLORIDE 0.9 % IV SOLN: INTRAVENOUS | Status: DC

## 2024-05-02 MED ORDER — ONDANSETRON HCL 4 MG/2ML IJ SOLN: 4.0000 mg | Freq: Four times a day (QID) | INTRAMUSCULAR | Status: DC | PRN

## 2024-05-02 MED ORDER — VENLAFAXINE HCL ER 150 MG PO CP24
150.0000 mg | ORAL_CAPSULE | Freq: Every day | ORAL | Status: DC
  Administered 2024-05-02 – 2024-05-05 (×4): 150 mg via ORAL
  Filled 2024-05-02 (×4): qty 1

## 2024-05-02 MED ORDER — IOHEXOL 300 MG/ML  SOLN
100.0000 mL | Freq: Once | INTRAMUSCULAR | Status: AC | PRN
  Administered 2024-05-02: 100 mL via INTRAVENOUS

## 2024-05-02 MED ORDER — ACETAMINOPHEN 325 MG PO TABS: 650.0000 mg | ORAL_TABLET | Freq: Four times a day (QID) | ORAL | Status: DC | PRN

## 2024-05-02 MED ORDER — ACETAMINOPHEN 650 MG RE SUPP: 650.0000 mg | Freq: Four times a day (QID) | RECTAL | Status: DC | PRN

## 2024-05-02 NOTE — ED Provider Notes (Signed)
  Physical Exam  BP (!) 137/91   Pulse 84   Temp 99 F (37.2 C) (Oral)   Resp 18   LMP 04/10/2024   SpO2 98%   Physical Exam  Procedures  Procedures  ED Course / MDM    Medical Decision Making Amount and/or Complexity of Data Reviewed Labs: ordered. Radiology: ordered.  Risk Prescription drug management.  Received patient from Dr. Wanita 48 year old female presented last night with epigastric pain Patient with elevated lipase, mildly elevated liver enzymes Pending CT results Plan admission  CT results with diffuse gallbladder wall edema measuring up to 9 mm no biliary duct dilatation, no gallstone identified. There is pancreatic edema with mild peripancreatic soft tissue haziness no main ductal dilation or mass no focal fluid collections. Patient reexamined.  She continues to have epigastric pain and pain is currently 4 out of 10 nausea has improved and she is not actively vomiting.  Will give repeat dose of antiemetic and pain medicine Plan discussion with GI and hospitalist for admission. Care discussed with Dr. Roxane who accepts patient for admission    Levander Houston, MD 05/07/24 [phone]

## 2024-05-02 NOTE — Plan of Care (Signed)

## 2024-05-02 NOTE — H&P (Signed)
 History and Physical  Kimberly Nelson FMW:[id number] DOB: [birth year] DOA: 05/02/2024  PCP: System, Provider Not In   Chief Complaint: Abdominal pain  HPI: Kimberly Nelson is a 48 y.o. female with medical history significant for hypertension, depression being admitted to the hospital with acute pancreatitis without necrosis or fluid collection.  States that she had burning epigastric abdominal pain about 1 week ago, it resolved on its own.  Last night, she had recurrence of same burning nonradiating abdominal pain, with associated nausea and vomiting.  No fevers.  Evaluation in the emergency department revealed evidence of pancreatitis with elevated lipase, and pancreatic inflammation without complicating features.  She was given pain and nausea medication, IV fluids, and admitted to the hospitalist service at [facility].  Review of Systems: Please see HPI for pertinent positives and negatives. A complete 10 system review of systems are otherwise negative.  Past Medical History:  Diagnosis Date   Abnormal Pap smear    colpo, ok since   Anxiety    on meds, doing well   Artificial insemination    Hx only   ASCUS (atypical squamous cells of undetermined significance) on Pap smear    Depression    Heartburn in pregnancy    Hemorrhoids    Infertility associated with anovulation    Laryngopharyngeal reflux    Missed ab 01/2013   6 wks   Postpartum care following cesarean delivery (10/19) 07/05/2014   Postpartum care following cesarean delivery (8/14, FTD) 04/30/2012   resolved   Seasonal allergies    Termination of pregnancy (fetus)    10 wks   Urinary tract infection    Hx - resolved   Past Surgical History:  Procedure Laterality Date   CESAREAN SECTION  04/30/2012   Procedure: CESAREAN SECTION;  Surgeon: Charlie JINNY Flowers, MD;  Location: [facility] ORS;  Service: Obstetrics;  Laterality: N/A;   CESAREAN SECTION N/A 07/05/2014   Procedure: Repeat CESAREAN SECTION;  Surgeon: Percilla Burly, MD;   Location: [facility] ORS;  Service: Obstetrics;  Laterality: N/A;   INDUCED ABORTION     WISDOM TOOTH EXTRACTION     Social History:  reports that she quit smoking about 24 years ago. Her smoking use included cigarettes. She started smoking about 30 years ago. She has a 3 pack-year smoking history. She has never used smokeless tobacco. She reports current alcohol use. She reports that she does not use drugs.  Allergies  Allergen Reactions   Penicillins Hives   Sulfa Antibiotics Hives    Family History  Problem Relation Age of Onset   Cancer Other    Hypertension Other    Hyperlipidemia Other    Hypertension Mother    Hypertension Father    Hypertension Sister    Hypertension Brother    Diabetes Maternal Uncle    Heart attack Maternal Uncle    Cancer Maternal Grandfather        blood cancer?   Heart attack Paternal Grandmother    Other Neg Hx      Prior to Admission medications   Medication Sig Start Date End Date Taking? Authorizing Provider  amLODipine  (NORVASC ) 5 MG tablet Take 5 mg by mouth daily.   Yes [provider]  buPROPion  (WELLBUTRIN  XL) 300 MG 24 hr tablet Take 300 mg by mouth daily.   Yes [provider]  cyanocobalamin (VITAMIN B12) 100 MCG tablet Take 200 mcg by mouth daily.   Yes [provider]  ergocalciferol (VITAMIN D2) 1.25 MG (50000 UT) capsule  Take 50,000 Units by mouth once a week. Tues   Yes [provider]  loratadine  (CLARITIN ) 10 MG tablet Take 10 mg by mouth at bedtime.    Yes [provider]  losartan  (COZAAR ) 50 MG tablet Take 50 mg by mouth daily.   Yes [provider]  magnesium  gluconate (MAGONATE) 500 (27 Mg) MG TABS tablet Take 500 mg by mouth in the morning and at bedtime.   Yes [provider]  venlafaxine  XR (EFFEXOR -XR) 150 MG 24 hr capsule Take 150 mg by mouth daily with breakfast.   Yes [provider]  ALPRAZolam  (XANAX ) 0.25 MG tablet Take 1 tablet (0.25 mg total) by  mouth 2 (two) times daily as needed for anxiety. Patient not taking: Reported on 05/02/2024 07/03/18   Lavoie, [name], MD  buPROPion  (WELLBUTRIN  XL) 150 MG 24 hr tablet Take 150 mg by mouth every morning. Patient not taking: Reported on 05/02/2024 05/09/22   [provider]  cholecalciferol (VITAMIN D3) 25 MCG (1000 UNIT) tablet Take 1,000 Units by mouth daily. Patient not taking: Reported on 05/02/2024    [provider]  Multiple Vitamin (MULTIVITAMIN) tablet Take 1 tablet by mouth daily. Patient not taking: Reported on 05/02/2024    [provider]    Physical Exam: BP (!) 140/88 (BP Location: Left Arm)   Pulse 66   Temp 98 F (36.7 C) (Oral)   Resp 15   Ht 5' 1 (1.549 m)   Wt 65 kg   LMP 04/10/2024   SpO2 100%   BMI 27.10 kg/m  General:  Alert, oriented, calm, in no acute distress.  Her parents are at the bedside. Eyes: EOMI, clear conjuctivae, white sclerea Neck: supple, no masses, trachea mildline  Cardiovascular: RRR, no murmurs or rubs, no peripheral edema  Respiratory: clear to auscultation bilaterally, no wheezes, no crackles  Abdomen: soft, tender, nondistended, normal bowel tones heard  Skin: dry, no rashes  Musculoskeletal: no joint effusions, normal range of motion  Psychiatric: appropriate affect, normal speech  Neurologic: extraocular muscles intact, clear speech, moving all extremities with intact sensorium         Labs on Admission:  Basic Metabolic Panel: Recent Labs  Lab 05/02/24 0320  NA 138  K 4.0  CL 102  CO2 25  GLUCOSE 143*  BUN 14  CREATININE 0.83  CALCIUM 9.5   Liver Function Tests: Recent Labs  Lab 05/02/24 0320  AST 412*  ALT 203*  ALKPHOS 74  BILITOT 0.8  PROT 7.6  ALBUMIN 4.8   Recent Labs  Lab 05/02/24 0320  LIPASE >2,800*   No results for input(s): AMMONIA in the last 168 hours. CBC: Recent Labs  Lab 05/02/24 0320  WBC 14.0*  HGB 13.2  HCT 37.9  MCV 91.5  PLT 312   Cardiac  Enzymes: No results for input(s): CKTOTAL, CKMB, CKMBINDEX, TROPONINI in the last 168 hours. BNP (last 3 results) No results for input(s): BNP in the last 8760 hours.  ProBNP (last 3 results) No results for input(s): PROBNP in the last 8760 hours.  CBG: No results for input(s): GLUCAP in the last 168 hours.  Radiological Exams on Admission: CT ABDOMEN PELVIS W CONTRAST Result Date: 05/02/2024 EXAM: CT ABDOMEN AND PELVIS WITH CONTRAST 05/02/2024 07:00:49 AM TECHNIQUE: CT of the abdomen and pelvis was performed with the administration of intravenous contrast. Multiplanar reformatted images are provided for review. Automated exposure control, iterative reconstruction, and/or weight based adjustment of the mA/kV was utilized to reduce the  radiation dose to as low as reasonably achievable. COMPARISON: None available. CLINICAL HISTORY: Pancreatitis, acute, severe. Intermittent sharp abdominal pains upper and lower, nausea happened 1 week ago but went away and came back last night. History of c-section. FINDINGS: LOWER CHEST: No acute abnormality. LIVER: The liver is unremarkable. GALLBLADDER AND BILE DUCTS: Diffuse gallbladder wall edema identified which measures up to 9 mm in thickness. No biliary ductal dilatation. No gallstones identified. SPLEEN: No acute abnormality. PANCREAS: There is pancreatic edema with mild peripancreatic soft tissue haziness. No main duct dilatation or mass. No focal fluid collections to suggest pseudocyst. No pancreatic necrosis. ADRENAL GLANDS: No acute abnormality. KIDNEYS, URETERS AND BLADDER: No stones in the kidneys or ureters. No hydronephrosis. No perinephric or periureteral stranding. Urinary bladder is unremarkable. GI AND BOWEL: Moderate stool burden noted throughout the colon. No signs of bowel obstruction or inflammation. No bowel wall thickening. PERITONEUM AND RETROPERITONEUM: No significant free fluid. No focal fluid collections. VASCULATURE: Aorta is  normal in caliber. LYMPH NODES: No lymphadenopathy. REPRODUCTIVE ORGANS: No acute abnormality. BONES AND SOFT TISSUES: No acute osseous abnormality. No focal soft tissue abnormality. IMPRESSION: 1. Pancreatic edema with mild peripancreatic soft tissue haziness, consistent with acute pancreatitis. No pseudocyst or pancreatic necrosis. 2. Diffuse gallbladder wall edema measuring up to 9 mm in thickness. Nonspecific in this setting of acute pancreatitis. 3. Moderate stool burden throughout the colon. No bowel obstruction or inflammation. Electronically signed by: Waddell Calk MD 05/02/2024 07:29 AM EDT RP Workstation: [id number]   Assessment/Plan Andrea LITTIE Sorrow is a 49 y.o. female with medical history significant for hypertension, depression being admitted to the hospital with acute pancreatitis without necrosis or fluid collection.  Acute pancreatitis-unclear etiology, no significant alcohol history, culprit medications, no obvious evidence of gallstone disease. -Inpatient admission -IV fluids -Pain and nausea medication as needed -N.p.o. except for sips with meds and ice chips  Depression-Wellbutrin , Effexor   Hypertension- amlodipine , Cozaar   DVT prophylaxis: Lovenox      Code Status: Full Code  Consults called: ER provider consulted [facility]  Admission status: The appropriate patient status for this patient is INPATIENT. Inpatient status is judged to be reasonable and necessary in order to provide the required intensity of service to ensure the patient's safety. The patient's presenting symptoms, physical exam findings, and initial radiographic and laboratory data in the context of their chronic comorbidities is felt to place them at high risk for further clinical deterioration. Furthermore, it is not anticipated that the patient will be medically stable for discharge from the hospital within 2 midnights of admission.    I certify that at the point of admission it is my clinical judgment  that the patient will require inpatient hospital care spanning beyond 2 midnights from the point of admission due to high intensity of service, high risk for further deterioration and high frequency of surveillance required  Time spent: 53 minutes  Brenen Beigel CHRISTELLA Gail MD [facility] Pager [phone]  If 7PM-7AM, please contact night-coverage [email or web address] Password [facility]  05/02/2024, 12:54 PM

## 2024-05-02 NOTE — ED Triage Notes (Signed)
 Pt reports generalized abd pain. Pt reports N&V. Pt states it started last night after she had dinner.

## 2024-05-02 NOTE — ED Notes (Signed)
 Called Kimberly Nelson at [facility] for transport

## 2024-05-02 NOTE — ED Provider Notes (Signed)
 [place] EMERGENCY DEPARTMENT AT [facility] Provider Note   CSN: [id number] Arrival date & time: 05/02/24  0254     Patient presents with: Abdominal Pain   Kimberly Nelson is a 48 y.o. female.   Patient presents to the emergency department for evaluation of severe upper abdominal pain, nausea and vomiting.  Symptoms began after dinner.  Patient reports that she had a similar episode last week.  Pain was severe initially, she actually drove to the emergency department but the pain resolved as she got here and was not seen.       Prior to Admission medications   Medication Sig Start Date End Date Taking? Authorizing Provider  ALPRAZolam  (XANAX ) 0.25 MG tablet Take 1 tablet (0.25 mg total) by mouth 2 (two) times daily as needed for anxiety. 07/03/18   Lavoie, [name], MD  buPROPion  (WELLBUTRIN  XL) 150 MG 24 hr tablet Take 150 mg by mouth every morning. 05/09/22   [provider]  cholecalciferol (VITAMIN D3) 25 MCG (1000 UNIT) tablet Take 1,000 Units by mouth daily.    [provider]  loratadine  (CLARITIN ) 10 MG tablet Take 10 mg by mouth at bedtime.     [provider]  Multiple Vitamin (MULTIVITAMIN) tablet Take 1 tablet by mouth daily.    [provider]  venlafaxine  XR (EFFEXOR -XR) 75 MG 24 hr capsule Take 225 mg by mouth daily. 05/09/22   [provider]    Allergies: Penicillins and Sulfa antibiotics    Review of Systems  Updated Vital Signs BP (!) 137/91   Pulse 84   Temp 99 F (37.2 C) (Oral)   Resp 18   LMP 04/10/2024   SpO2 98%   Physical Exam Vitals and nursing note reviewed.  Constitutional:      General: She is in acute distress.     Appearance: She is well-developed.  HENT:     Head: Normocephalic and atraumatic.     Mouth/Throat:     Mouth: Mucous membranes are moist.  Eyes:     General: Vision grossly intact. Gaze aligned appropriately.     Extraocular Movements: Extraocular movements  intact.     Conjunctiva/sclera: Conjunctivae normal.  Cardiovascular:     Rate and Rhythm: Normal rate and regular rhythm.     Pulses: Normal pulses.     Heart sounds: Normal heart sounds, S1 normal and S2 normal. No murmur heard.    No friction rub. No gallop.  Pulmonary:     Effort: Pulmonary effort is normal. No respiratory distress.     Breath sounds: Normal breath sounds.  Abdominal:     General: Bowel sounds are normal.     Palpations: Abdomen is soft.     Tenderness: There is no abdominal tenderness. There is no guarding or rebound.     Hernia: No hernia is present.  Musculoskeletal:        General: No swelling.     Cervical back: Full passive range of motion without pain, normal range of motion and neck supple. No spinous process tenderness or muscular tenderness. Normal range of motion.     Right lower leg: No edema.     Left lower leg: No edema.  Skin:    General: Skin is warm and dry.     Capillary Refill: Capillary refill takes less than 2 seconds.     Findings: No ecchymosis, erythema, rash or wound.  Neurological:     General: No focal deficit present.  Mental Status: She is alert and oriented to person, place, and time.     GCS: GCS eye subscore is 4. GCS verbal subscore is 5. GCS motor subscore is 6.     Cranial Nerves: Cranial nerves 2-12 are intact.     Sensory: Sensation is intact.     Motor: Motor function is intact.     Coordination: Coordination is intact.  Psychiatric:        Attention and Perception: Attention normal.        Mood and Affect: Mood normal.        Speech: Speech normal.        Behavior: Behavior normal.     (all labs ordered are listed, but only abnormal results are displayed) Labs Reviewed  LIPASE, BLOOD - Abnormal; Notable for the following components:      Result Value   Lipase >2,800 (*)    All other components within normal limits  COMPREHENSIVE METABOLIC PANEL WITH GFR - Abnormal; Notable for the following components:    Glucose, Bld 143 (*)    AST 412 (*)    ALT 203 (*)    All other components within normal limits  CBC - Abnormal; Notable for the following components:   WBC 14.0 (*)    All other components within normal limits  URINALYSIS, ROUTINE W REFLEX MICROSCOPIC - Abnormal; Notable for the following components:   APPearance HAZY (*)    Bacteria, UA RARE (*)    Crystals PRESENT (*)    All other components within normal limits  PREGNANCY, URINE    EKG: None  Radiology: No results found.   Procedures   Medications Ordered in the ED  sodium chloride  0.9 % bolus 1,000 mL (1,000 mLs Intravenous New Bag/Given 05/02/24 0625)  HYDROmorphone  (DILAUDID ) injection 1 mg (1 mg Intravenous Given 05/02/24 0622)  ondansetron  (ZOFRAN ) injection 4 mg (4 mg Intravenous Given 05/02/24 9377)                                    Medical Decision Making Amount and/or Complexity of Data Reviewed External Data Reviewed: labs. Labs: ordered. Decision-making details documented in ED Course. Radiology: ordered.  Risk Prescription drug management.   Differential Diagnosis considered includes, but not limited to: Cholelithiasis; cholecystitis; cholangitis; bowel obstruction; esophagitis; gastritis; peptic ulcer disease; pancreatitis; cardiac.  Patient presents to the emergency department for evaluation of upper abdominal pain.  This is a second episode of severe abdominal pain that she has had in a week.  Symptoms began after eating.  Patient appears very uncomfortable.  Lab work reveals significant elevation of lipase consistent with pancreatitis.  She has some LFT abnormalities as well.  Patient denies alcohol abuse.  Suspect gallstone pancreatitis.  Will undergo imaging to further evaluate.  Provided analgesia.  Will signout to oncoming ER physician to follow-up imaging and admit patient.     Final diagnoses:  Acute pancreatitis, unspecified complication status, unspecified pancreatitis type    ED  Discharge Orders     None          Haze Lonni PARAS, MD 05/02/24 [id number]

## 2024-05-02 NOTE — Consult Note (Signed)
 [facility] Gastroenterology Consult  Referring Provider: No ref. provider found Primary Care Physician:  System, Provider Not In Primary Gastroenterologist: Unassigned  Reason for Consultation: Acute pancreatitis  SUBJECTIVE:   HPI: Kimberly Nelson is a 48 y.o. female with medical history as below presented to emergency department for abdominal pain.  She noted that pain in her upper abdomen began roughly 1 week prior though spontaneously resolved.  Pain recurred yesterday evening with some radiation to her upper back.  No nausea or vomiting.  She has some chronic alternating constipation and diarrhea.  No chest pain or shortness of breath.  No alcohol use.  Labs showed lipase greater than 2800, AST/ALT 412/203, total bilirubin 0.8. CT scan of abdomen pelvis showed diffuse gallbladder edema, no biliary dilatation, no cholelithiasis, peripancreatic edema, no fluid collections, moderate stool burden.  Past Medical History:  Diagnosis Date   Abnormal Pap smear    colpo, ok since   Anxiety    on meds, doing well   Artificial insemination    Hx only   ASCUS (atypical squamous cells of undetermined significance) on Pap smear    Depression    Heartburn in pregnancy    Hemorrhoids    Infertility associated with anovulation    Laryngopharyngeal reflux    Missed ab 01/2013   6 wks   Postpartum care following cesarean delivery (10/19) 07/05/2014   Postpartum care following cesarean delivery (8/14, FTD) 04/30/2012   resolved   Seasonal allergies    Termination of pregnancy (fetus)    10 wks   Urinary tract infection    Hx - resolved   Past Surgical History:  Procedure Laterality Date   CESAREAN SECTION  04/30/2012   Procedure: CESAREAN SECTION;  Surgeon: Charlie JINNY Flowers, MD;  Location: [facility];  Service: Obstetrics;  Laterality: N/A;   CESAREAN SECTION N/A 07/05/2014   Procedure: Repeat CESAREAN SECTION;  Surgeon: Percilla Burly, MD;  Location: [facility] ORS;  Service: Obstetrics;  Laterality:  N/A;   INDUCED ABORTION     WISDOM TOOTH EXTRACTION     Prior to Admission medications   Medication Sig Start Date End Date Taking? Authorizing Provider  amLODipine  (NORVASC ) 5 MG tablet Take 5 mg by mouth daily.   Yes [provider]  buPROPion  (WELLBUTRIN  XL) 300 MG 24 hr tablet Take 300 mg by mouth daily.   Yes [provider]  cyanocobalamin (VITAMIN B12) 100 MCG tablet Take 200 mcg by mouth daily.   Yes [provider]  ergocalciferol (VITAMIN D2) 1.25 MG (50000 UT) capsule Take 50,000 Units by mouth once a week. Tues   Yes [provider]  loratadine  (CLARITIN ) 10 MG tablet Take 10 mg by mouth at bedtime.    Yes [provider]  losartan  (COZAAR ) 50 MG tablet Take 50 mg by mouth daily.   Yes [provider]  magnesium  gluconate (MAGONATE) 500 (27 Mg) MG TABS tablet Take 500 mg by mouth in the morning and at bedtime.   Yes [provider]  venlafaxine  XR (EFFEXOR -XR) 150 MG 24 hr capsule Take 150 mg by mouth daily with breakfast.   Yes [provider]  ALPRAZolam  (XANAX ) 0.25 MG tablet Take 1 tablet (0.25 mg total) by mouth 2 (two) times daily as needed for anxiety. Patient not taking: Reported on 05/02/2024 07/03/18   Lavoie, [name], MD  atomoxetine (STRATTERA) 25 MG capsule Take 25 mg by mouth daily. Patient not taking: Reported on 05/02/2024    [provider]  buPROPion  (WELLBUTRIN   XL) 150 MG 24 hr tablet Take 150 mg by mouth every morning. Patient not taking: Reported on 05/02/2024 05/09/22   [provider]  cholecalciferol (VITAMIN D3) 25 MCG (1000 UNIT) tablet Take 1,000 Units by mouth daily. Patient not taking: Reported on 05/02/2024    [provider]  Multiple Vitamin (MULTIVITAMIN) tablet Take 1 tablet by mouth daily. Patient not taking: Reported on 05/02/2024    [provider]  phentermine 15 MG capsule Take 15 mg by mouth daily. Patient not taking: Reported on  05/02/2024 01/15/24   [provider]   Current Facility-Administered Medications  Medication Dose Route Frequency Provider Last Rate Last Admin   0.9 %  sodium chloride  infusion   Intravenous Continuous Zella, Mir M, MD 150 mL/hr at 05/02/24 1215 New Bag at 05/02/24 1215   acetaminophen  (TYLENOL ) tablet 650 mg  650 mg Oral Q6H PRN Zella, Mir M, MD       Or   acetaminophen  (TYLENOL ) suppository 650 mg  650 mg Rectal Q6H PRN Zella, Mir M, MD       albuterol  (PROVENTIL ) (2.5 MG/3ML) 0.083% nebulizer solution 2.5 mg  2.5 mg Nebulization Q2H PRN Zella, Mir M, MD       amLODipine  (NORVASC ) tablet 5 mg  5 mg Oral Daily Zella, Mir M, MD   5 mg at 05/02/24 1349   buPROPion  (WELLBUTRIN  XL) 24 hr tablet 300 mg  300 mg Oral Daily Zella, Mir M, MD   300 mg at 05/02/24 1349   enoxaparin  (LOVENOX ) injection 40 mg  40 mg Subcutaneous QHS Zella, Mir M, MD       HYDROmorphone  (DILAUDID ) injection 1 mg  1 mg Intravenous Q3H PRN Zella, Mir M, MD   1 mg at 05/02/24 1210   loratadine  (CLARITIN ) tablet 10 mg  10 mg Oral QHS Zella, Mir M, MD       losartan  (COZAAR ) tablet 50 mg  50 mg Oral Daily Zella, Mir M, MD   50 mg at 05/02/24 1349   magnesium  gluconate (MAGONATE) tablet 500 mg  500 mg Oral Daily Zella, Mir M, MD   500 mg at 05/02/24 1349   ondansetron  (ZOFRAN ) injection 4 mg  4 mg Intravenous Q6H PRN Zella, Mir M, MD       oxyCODONE  (Oxy IR/ROXICODONE ) immediate release tablet 5 mg  5 mg Oral Q4H PRN Zella, Mir M, MD       traZODone  (DESYREL ) tablet 25 mg  25 mg Oral QHS PRN Zella, Mir M, MD       venlafaxine  XR (EFFEXOR -XR) 24 hr capsule 150 mg  150 mg Oral Q breakfast Zella, Mir M, MD   150 mg at 05/02/24 1349   Allergies as of 05/02/2024 - Review Complete 05/02/2024  Allergen Reaction Noted   Penicillins Hives 02/21/2012   Sulfa antibiotics Hives 02/21/2012   Family History  Problem Relation Age of Onset   Cancer Other     Hypertension Other    Hyperlipidemia Other    Hypertension Mother    Hypertension Father    Hypertension Sister    Hypertension Brother    Diabetes Maternal Uncle    Heart attack Maternal Uncle    Cancer Maternal Grandfather        blood cancer?   Heart attack Paternal Grandmother    Other Neg Hx    Social History   Socioeconomic History   Marital status: Married    Spouse name: Not on file   Number of children:  Not on file   Years of education: Not on file   Highest education level: Not on file  Occupational History   Not on file  Tobacco Use   Smoking status: Former    Current packs/day: 0.00    Average packs/day: 0.5 packs/day for 6.0 years (3.0 ttl pk-yrs)    Types: Cigarettes    Start date: 04/29/1994    Quit date: 04/29/2000    Years since quitting: 24.0   Smokeless tobacco: Never  Vaping Use   Vaping status: Never Used  Substance and Sexual Activity   Alcohol use: Yes    Comment: 4 glasses of wine a week    Drug use: No   Sexual activity: Yes    Partners: Male    Birth control/protection: None    Comment: 1st intercourse- 18, partners- 8, married- 8 yrs    Other Topics Concern   Not on file  Social History Narrative   Not on file   Social Drivers of Health   Financial Resource Strain: Not on file  Food Insecurity: No Food Insecurity (05/02/2024)   Hunger Vital Sign    Worried About Running Out of Food in the Last Year: Never true    Ran Out of Food in the Last Year: Never true  Transportation Needs: No Transportation Needs (05/02/2024)   PRAPARE - Administrator, Civil Service (Medical): No    Lack of Transportation (Non-Medical): No  Physical Activity: Not on file  Stress: Not on file  Social Connections: Not on file  Intimate Partner Violence: Not At Risk (05/02/2024)   Humiliation, Afraid, Rape, and Kick questionnaire    Fear of Current or Ex-Partner: No    Emotionally Abused: No    Physically Abused: No    Sexually Abused: No    Review of Systems:  Review of Systems  Respiratory:  Negative for shortness of breath.   Cardiovascular:  Negative for chest pain.  Gastrointestinal:  Positive for abdominal pain.    OBJECTIVE:   Temp:  [97.7 F (36.5 C)-99 F (37.2 C)] 98 F (36.7 C) (08/16 1121) Pulse Rate:  [66-97] 66 (08/16 1121) Resp:  [15-18] 15 (08/16 1121) BP: (114-140)/(72-94) 116/72 (08/16 1349) SpO2:  [96 %-100 %] 100 % (08/16 1121) Weight:  [65 kg] 65 kg (08/16 1131)   Physical Exam Constitutional:      General: She is not in acute distress.    Appearance: She is not ill-appearing, toxic-appearing or diaphoretic.  Cardiovascular:     Rate and Rhythm: Normal rate and regular rhythm.  Pulmonary:     Effort: No respiratory distress.     Breath sounds: Normal breath sounds.  Abdominal:     General: Bowel sounds are normal. There is no distension.     Palpations: Abdomen is soft.     Tenderness: There is no abdominal tenderness. There is no guarding.  Neurological:     Mental Status: She is alert.     Labs: Recent Labs    05/02/24 0320  WBC 14.0*  HGB 13.2  HCT 37.9  PLT 312   BMET Recent Labs    05/02/24 0320  NA 138  K 4.0  CL 102  CO2 25  GLUCOSE 143*  BUN 14  CREATININE 0.83  CALCIUM 9.5   LFT Recent Labs    05/02/24 0320  PROT 7.6  ALBUMIN 4.8  AST 412*  ALT 203*  ALKPHOS 74  BILITOT 0.8   PT/INR No results for input(s): LABPROT,  INR in the last 72 hours.  Diagnostic imaging: CT ABDOMEN PELVIS W CONTRAST Result Date: 05/02/2024 EXAM: CT ABDOMEN AND PELVIS WITH CONTRAST 05/02/2024 07:00:49 AM TECHNIQUE: CT of the abdomen and pelvis was performed with the administration of intravenous contrast. Multiplanar reformatted images are provided for review. Automated exposure control, iterative reconstruction, and/or weight based adjustment of the mA/kV was utilized to reduce the radiation dose to as low as reasonably achievable. COMPARISON: None available. CLINICAL  HISTORY: Pancreatitis, acute, severe. Intermittent sharp abdominal pains upper and lower, nausea happened 1 week ago but went away and came back last night. History of c-section. FINDINGS: LOWER CHEST: No acute abnormality. LIVER: The liver is unremarkable. GALLBLADDER AND BILE DUCTS: Diffuse gallbladder wall edema identified which measures up to 9 mm in thickness. No biliary ductal dilatation. No gallstones identified. SPLEEN: No acute abnormality. PANCREAS: There is pancreatic edema with mild peripancreatic soft tissue haziness. No main duct dilatation or mass. No focal fluid collections to suggest pseudocyst. No pancreatic necrosis. ADRENAL GLANDS: No acute abnormality. KIDNEYS, URETERS AND BLADDER: No stones in the kidneys or ureters. No hydronephrosis. No perinephric or periureteral stranding. Urinary bladder is unremarkable. GI AND BOWEL: Moderate stool burden noted throughout the colon. No signs of bowel obstruction or inflammation. No bowel wall thickening. PERITONEUM AND RETROPERITONEUM: No significant free fluid. No focal fluid collections. VASCULATURE: Aorta is normal in caliber. LYMPH NODES: No lymphadenopathy. REPRODUCTIVE ORGANS: No acute abnormality. BONES AND SOFT TISSUES: No acute osseous abnormality. No focal soft tissue abnormality. IMPRESSION: 1. Pancreatic edema with mild peripancreatic soft tissue haziness, consistent with acute pancreatitis. No pseudocyst or pancreatic necrosis. 2. Diffuse gallbladder wall edema measuring up to 9 mm in thickness. Nonspecific in this setting of acute pancreatitis. 3. Moderate stool burden throughout the colon. No bowel obstruction or inflammation. Electronically signed by: Waddell Calk MD 05/02/2024 07:29 AM EDT RP Workstation: [id number]   IMPRESSION: Acute pancreatitis Transaminase elevation in hepatocellular pattern Altered bowel habits, chronic constipation/diarrhea  Colon cancer screening-patient noted that she has upcoming colonoscopy in January  2026  PLAN: - No suspicion for choledocholithiasis given no biliary dilatation, total bilirubin within normal limits - Increase suspicion for biliary/gallbladder cause of pancreatitis given imaging, recommend surgical evaluation - Continue supportive care for acute pancreatitis including IV fluids, pain medications per primary team - Patient would prefer to wait on diet at this moment, discussed that she could start with clear liquids and advance to low-fat as tolerated - [facility] will be available as needed, please recall if questions   LOS: 0 days   Estefana Keas, [facility]

## 2024-05-02 NOTE — ED Notes (Signed)
 Updated husband via phone with pt status. [facility] en route.

## 2024-05-03 DIAGNOSIS — K859 Acute pancreatitis without necrosis or infection, unspecified: Secondary | ICD-10-CM | POA: Diagnosis not present

## 2024-05-03 LAB — HIV ANTIBODY (ROUTINE TESTING W REFLEX): HIV Screen 4th Generation wRfx: NONREACTIVE

## 2024-05-03 LAB — BASIC METABOLIC PANEL WITH GFR
Anion gap: 7 (ref 5–15)
BUN: 9 mg/dL (ref 6–20)
CO2: 23 mmol/L (ref 22–32)
Calcium: 7.6 mg/dL — ABNORMAL LOW (ref 8.9–10.3)
Chloride: 105 mmol/L (ref 98–111)
Creatinine, Ser: 0.7 mg/dL (ref 0.44–1.00)
GFR, Estimated: >60 mL/min (ref >60)
Glucose, Bld: 65 mg/dL — ABNORMAL LOW (ref 70–99)
Potassium: 3.5 mmol/L (ref 3.5–5.1)
Sodium: 135 mmol/L (ref 135–145)

## 2024-05-03 LAB — CBC
HCT: 35.8 % — ABNORMAL LOW (ref 36.0–46.0)
Hemoglobin: 11.8 g/dL — ABNORMAL LOW (ref 12.0–15.0)
MCH: 31.8 pg (ref 26.0–34.0)
MCHC: 33 g/dL (ref 30.0–36.0)
MCV: 96.5 fL (ref 80.0–100.0)
Platelets: 251 K/uL (ref 150–400)
RBC: 3.71 MIL/uL — ABNORMAL LOW (ref 3.87–5.11)
RDW: 13.9 % (ref 11.5–15.5)
WBC: 7 K/uL (ref 4.0–10.5)
nRBC: 0 % (ref 0.0–0.2)

## 2024-05-03 MED ORDER — POLYETHYLENE GLYCOL 3350 17 G PO PACK
17.0000 g | PACK | Freq: Every day | ORAL | Status: DC
  Administered 2024-05-04 – 2024-05-05 (×2): 17 g via ORAL
  Filled 2024-05-03 (×3): qty 1

## 2024-05-03 MED ORDER — SENNOSIDES-DOCUSATE SODIUM 8.6-50 MG PO TABS
1.0000 | ORAL_TABLET | Freq: Every evening | ORAL | Status: DC | PRN
  Administered 2024-05-04: 1 via ORAL
  Filled 2024-05-03: qty 1

## 2024-05-03 NOTE — Consult Note (Signed)
 CC: abd pain  Requesting provider: Dr Juvenal  HPI: Kimberly Nelson is an 48 y.o. female who is here for pancreatitis.  She developed abd pain Fri evening radiating to her back.  These were similar to symptoms she had a week ago.  Work up in ED showed pancreatitis.    Past Medical History:  Diagnosis Date   Abnormal Pap smear    colpo, ok since   Anxiety    on meds, doing well   Artificial insemination    Hx only   ASCUS (atypical squamous cells of undetermined significance) on Pap smear    Depression    Heartburn in pregnancy    Hemorrhoids    Infertility associated with anovulation    Laryngopharyngeal reflux    Missed ab 01/2013   6 wks   Postpartum care following cesarean delivery (10/19) 07/05/2014   Postpartum care following cesarean delivery (8/14, FTD) 04/30/2012   resolved   Seasonal allergies    Termination of pregnancy (fetus)    10 wks   Urinary tract infection    Hx - resolved    Past Surgical History:  Procedure Laterality Date   CESAREAN SECTION  04/30/2012   Procedure: CESAREAN SECTION;  Surgeon: Charlie JINNY Flowers, MD;  Location: [facility];  Service: Obstetrics;  Laterality: N/A;   CESAREAN SECTION N/A 07/05/2014   Procedure: Repeat CESAREAN SECTION;  Surgeon: Percilla Burly, MD;  Location: [facility];  Service: Obstetrics;  Laterality: N/A;   INDUCED ABORTION     WISDOM TOOTH EXTRACTION      Family History  Problem Relation Age of Onset   Cancer Other    Hypertension Other    Hyperlipidemia Other    Hypertension Mother    Hypertension Father    Hypertension Sister    Hypertension Brother    Diabetes Maternal Uncle    Heart attack Maternal Uncle    Cancer Maternal Grandfather        blood cancer?   Heart attack Paternal Grandmother    Other Neg Hx     Social:  reports that she quit smoking about 24 years ago. Her smoking use included cigarettes. She started smoking about 30 years ago. She has a 3 pack-year smoking history. She has never used  smokeless tobacco. She reports current alcohol use. She reports that she does not use drugs.  Allergies:  Allergies  Allergen Reactions   Penicillins Hives   Sulfa Antibiotics Hives    Medications: I have reviewed the patient's current medications.  Results for orders placed or performed during the hospital encounter of 05/02/24 (from the past 48 hours)  Lipase, blood     Status: Abnormal   Collection Time: 05/02/24  3:20 AM  Result Value Ref Range   Lipase >2,800 (H) 11 - 51 U/L    Comment: Performed at [facility], [address], [place], [place] [address]  Comprehensive metabolic panel     Status: Abnormal   Collection Time: 05/02/24  3:20 AM  Result Value Ref Range   Sodium 138 135 - 145 mmol/L   Potassium 4.0 3.5 - 5.1 mmol/L   Chloride 102 98 - 111 mmol/L   CO2 25 22 - 32 mmol/L   Glucose, Bld 143 (H) 70 - 99 mg/dL    Comment: Glucose reference range applies only to samples taken after fasting for at least 8 hours.   BUN 14 6 - 20 mg/dL   Creatinine, Ser 9.16 0.44 - 1.00 mg/dL  Calcium 9.5 8.9 - 10.3 mg/dL   Total Protein 7.6 6.5 - 8.1 g/dL   Albumin 4.8 3.5 - 5.0 g/dL   AST 587 (H) 15 - 41 U/L   ALT 203 (H) 0 - 44 U/L   Alkaline Phosphatase 74 38 - 126 U/L   Total Bilirubin 0.8 0.0 - 1.2 mg/dL   GFR, Estimated >39 >39 mL/min    Comment: (NOTE) Calculated using the CKD-EPI Creatinine Equation (2021)    Anion gap 12 5 - 15    Comment: Performed at [facility], [address] [place], [place] [address]  CBC     Status: Abnormal   Collection Time: 05/02/24  3:20 AM  Result Value Ref Range   WBC 14.0 (H) 4.0 - 10.5 K/uL   RBC 4.14 3.87 - 5.11 MIL/uL   Hemoglobin 13.2 12.0 - 15.0 g/dL   HCT 62.0 63.9 - 53.9 %   MCV 91.5 80.0 - 100.0 fL   MCH 31.9 26.0 - 34.0 pg   MCHC 34.8 30.0 - 36.0 g/dL   RDW 86.3 88.4 - 84.4 %   Platelets 312 150 - 400 K/uL   nRBC 0.0 0.0 - 0.2 %    Comment: Performed at [facility], [address] [place], [place] [address]  Urinalysis, Routine w reflex microscopic -Urine, Clean Catch     Status: Abnormal   Collection Time: 05/02/24  3:20 AM  Result Value Ref Range   Color, Urine YELLOW YELLOW   APPearance HAZY (A) CLEAR   Specific Gravity, Urine 1.010 1.005 - 1.030   pH 7.5 5.0 - 8.0   Glucose, UA NEGATIVE NEGATIVE mg/dL   Hgb urine dipstick NEGATIVE NEGATIVE   Bilirubin Urine NEGATIVE NEGATIVE   Ketones, ur NEGATIVE NEGATIVE mg/dL   Protein, ur NEGATIVE NEGATIVE mg/dL   Nitrite NEGATIVE NEGATIVE   Leukocytes,Ua NEGATIVE NEGATIVE   RBC / HPF 0-5 0 - 5 RBC/hpf   WBC, UA 0-5 0 - 5 WBC/hpf   Bacteria, UA RARE (A) NONE SEEN   Squamous Epithelial / HPF 0-5 0 - 5 /HPF   Amorphous Crystal PRESENT    Crystals PRESENT (A) NEGATIVE    Comment: Performed at [facility], [address], [place], [place] [address]  Pregnancy, urine     Status: None   Collection Time: 05/02/24  3:20 AM  Result Value Ref Range   Preg Test, Ur NEGATIVE NEGATIVE    Comment:        THE SENSITIVITY OF THIS METHODOLOGY IS >20 mIU/mL. Performed at [facility], [address] [place], [place] [address]   Basic metabolic panel     Status: Abnormal   Collection Time: 05/03/24  4:04 AM  Result Value Ref Range   Sodium 135 135 - 145 mmol/L   Potassium 3.5 3.5 - 5.1 mmol/L   Chloride 105 98 - 111 mmol/L   CO2 23 22 - 32 mmol/L   Glucose, Bld 65 (L) 70 - 99 mg/dL    Comment: Glucose reference range applies only to samples taken after fasting for at least 8 hours.   BUN 9 6 - 20 mg/dL   Creatinine, Ser 9.29 0.44 - 1.00 mg/dL   Calcium 7.6 (L) 8.9 - 10.3 mg/dL   GFR, Estimated >39 >39 mL/min    Comment: (NOTE) Calculated using the CKD-EPI Creatinine Equation (2021)    Anion gap 7 5 - 15    Comment: Performed at [facility], [address]  [place], [place] [address]  CBC     Status: Abnormal   Collection Time:  05/03/24  4:04 AM  Result Value Ref Range   WBC 7.0 4.0 - 10.5 K/uL   RBC 3.71 (L) 3.87 - 5.11 MIL/uL   Hemoglobin 11.8 (L) 12.0 - 15.0 g/dL   HCT 64.1 (L) 63.9 - 53.9 %   MCV 96.5 80.0 - 100.0 fL   MCH 31.8 26.0 - 34.0 pg   MCHC 33.0 30.0 - 36.0 g/dL   RDW 86.0 88.4 - 84.4 %   Platelets 251 150 - 400 K/uL   nRBC 0.0 0.0 - 0.2 %    Comment: Performed at [facility], [address], [place], [place] [address]    CT ABDOMEN PELVIS W CONTRAST Result Date: 05/02/2024 EXAM: CT ABDOMEN AND PELVIS WITH CONTRAST 05/02/2024 07:00:49 AM TECHNIQUE: CT of the abdomen and pelvis was performed with the administration of intravenous contrast. Multiplanar reformatted images are provided for review. Automated exposure control, iterative reconstruction, and/or weight based adjustment of the mA/kV was utilized to reduce the radiation dose to as low as reasonably achievable. COMPARISON: None available. CLINICAL HISTORY: Pancreatitis, acute, severe. Intermittent sharp abdominal pains upper and lower, nausea happened 1 week ago but went away and came back last night. History of c-section. FINDINGS: LOWER CHEST: No acute abnormality. LIVER: The liver is unremarkable. GALLBLADDER AND BILE DUCTS: Diffuse gallbladder wall edema identified which measures up to 9 mm in thickness. No biliary ductal dilatation. No gallstones identified. SPLEEN: No acute abnormality. PANCREAS: There is pancreatic edema with mild peripancreatic soft tissue haziness. No main duct dilatation or mass. No focal fluid collections to suggest pseudocyst. No pancreatic necrosis. ADRENAL GLANDS: No acute abnormality. KIDNEYS, URETERS AND BLADDER: No stones in the kidneys or ureters. No hydronephrosis. No perinephric or periureteral stranding. Urinary bladder is unremarkable. GI AND BOWEL: Moderate stool burden noted throughout the colon. No signs of bowel obstruction or inflammation. No bowel wall thickening. PERITONEUM AND  RETROPERITONEUM: No significant free fluid. No focal fluid collections. VASCULATURE: Aorta is normal in caliber. LYMPH NODES: No lymphadenopathy. REPRODUCTIVE ORGANS: No acute abnormality. BONES AND SOFT TISSUES: No acute osseous abnormality. No focal soft tissue abnormality. IMPRESSION: 1. Pancreatic edema with mild peripancreatic soft tissue haziness, consistent with acute pancreatitis. No pseudocyst or pancreatic necrosis. 2. Diffuse gallbladder wall edema measuring up to 9 mm in thickness. Nonspecific in this setting of acute pancreatitis. 3. Moderate stool burden throughout the colon. No bowel obstruction or inflammation. Electronically signed by: Waddell Calk MD 05/02/2024 07:29 AM EDT RP Workstation: [id number]    ROS - all of the below systems have been reviewed with the patient and positives are indicated with bold text General: chills, fever or night sweats Eyes: blurry vision or double vision ENT: epistaxis or sore throat Hematologic/Lymphatic: bleeding problems, blood clots or swollen lymph nodes Endocrine: temperature intolerance or unexpected weight changes Breast: new or changing breast lumps or nipple discharge Resp: cough, shortness of breath, or wheezing CV: chest pain or dyspnea on exertion GI: as per HPI GU: dysuria, trouble voiding, or hematuria Neuro: TIA or stroke symptoms    PE Blood pressure 94/63, pulse 76, temperature 98 F (36.7 C), temperature source Oral, resp. rate 17, height 5' 1 (1.549 m), weight 65 kg, last menstrual period 04/10/2024, SpO2 100%, unknown if currently breastfeeding. Constitutional: NAD; conversant; no deformities Eyes: Moist conjunctiva; no lid lag; anicteric; PERRL Neck: Trachea midline; no thyromegaly Lungs: Normal respiratory effort CV: RRR GI: Abd  TTP mid abd MSK: Normal range of motion of extremities; no clubbing/cyanosis Psychiatric: Appropriate affect; alert and oriented x3  Results for orders placed or performed during the  hospital encounter of 05/02/24 (from the past 48 hours)  Lipase, blood     Status: Abnormal   Collection Time: 05/02/24  3:20 AM  Result Value Ref Range   Lipase >2,800 (H) 11 - 51 U/L    Comment: Performed at [facility], [address] [place], [place] [address]  Comprehensive metabolic panel     Status: Abnormal   Collection Time: 05/02/24  3:20 AM  Result Value Ref Range   Sodium 138 135 - 145 mmol/L   Potassium 4.0 3.5 - 5.1 mmol/L   Chloride 102 98 - 111 mmol/L   CO2 25 22 - 32 mmol/L   Glucose, Bld 143 (H) 70 - 99 mg/dL    Comment: Glucose reference range applies only to samples taken after fasting for at least 8 hours.   BUN 14 6 - 20 mg/dL   Creatinine, Ser 9.16 0.44 - 1.00 mg/dL   Calcium 9.5 8.9 - 89.6 mg/dL   Total Protein 7.6 6.5 - 8.1 g/dL   Albumin 4.8 3.5 - 5.0 g/dL   AST 587 (H) 15 - 41 U/L   ALT 203 (H) 0 - 44 U/L   Alkaline Phosphatase 74 38 - 126 U/L   Total Bilirubin 0.8 0.0 - 1.2 mg/dL   GFR, Estimated >39 >39 mL/min    Comment: (NOTE) Calculated using the CKD-EPI Creatinine Equation (2021)    Anion gap 12 5 - 15    Comment: Performed at [facility], [address] [place], [place] [address]  CBC     Status: Abnormal   Collection Time: 05/02/24  3:20 AM  Result Value Ref Range   WBC 14.0 (H) 4.0 - 10.5 K/uL   RBC 4.14 3.87 - 5.11 MIL/uL   Hemoglobin 13.2 12.0 - 15.0 g/dL   HCT 62.0 63.9 - 53.9 %   MCV 91.5 80.0 - 100.0 fL   MCH 31.9 26.0 - 34.0 pg   MCHC 34.8 30.0 - 36.0 g/dL   RDW 86.3 88.4 - 84.4 %   Platelets 312 150 - 400 K/uL   nRBC 0.0 0.0 - 0.2 %    Comment: Performed at [facility], [address] [place], [place] [address]  Urinalysis, Routine w reflex microscopic -Urine, Clean Catch     Status: Abnormal   Collection Time: 05/02/24  3:20 AM  Result Value Ref Range   Color, Urine YELLOW YELLOW   APPearance HAZY (A) CLEAR   Specific Gravity, Urine 1.010 1.005 - 1.030   pH  7.5 5.0 - 8.0   Glucose, UA NEGATIVE NEGATIVE mg/dL   Hgb urine dipstick NEGATIVE NEGATIVE   Bilirubin Urine NEGATIVE NEGATIVE   Ketones, ur NEGATIVE NEGATIVE mg/dL   Protein, ur NEGATIVE NEGATIVE mg/dL   Nitrite NEGATIVE NEGATIVE   Leukocytes,Ua NEGATIVE NEGATIVE   RBC / HPF 0-5 0 - 5 RBC/hpf   WBC, UA 0-5 0 - 5 WBC/hpf   Bacteria, UA RARE (A) NONE SEEN   Squamous Epithelial / HPF 0-5 0 - 5 /HPF   Amorphous Crystal PRESENT    Crystals PRESENT (A) NEGATIVE    Comment: Performed at [facility], [address] [place], [place] [address]  Pregnancy, urine     Status: None   Collection Time: 05/02/24  3:20 AM  Result Value Ref Range  Preg Test, Ur NEGATIVE NEGATIVE    Comment:        THE SENSITIVITY OF THIS METHODOLOGY IS >20 mIU/mL. Performed at [facility], [address], [place], [place] [address]   Basic metabolic panel     Status: Abnormal   Collection Time: 05/03/24  4:04 AM  Result Value Ref Range   Sodium 135 135 - 145 mmol/L   Potassium 3.5 3.5 - 5.1 mmol/L   Chloride 105 98 - 111 mmol/L   CO2 23 22 - 32 mmol/L   Glucose, Bld 65 (L) 70 - 99 mg/dL    Comment: Glucose reference range applies only to samples taken after fasting for at least 8 hours.   BUN 9 6 - 20 mg/dL   Creatinine, Ser 9.29 0.44 - 1.00 mg/dL   Calcium 7.6 (L) 8.9 - 10.3 mg/dL   GFR, Estimated >39 >39 mL/min    Comment: (NOTE) Calculated using the CKD-EPI Creatinine Equation (2021)    Anion gap 7 5 - 15    Comment: Performed at [facility], [address] [place], [place] [address]  CBC     Status: Abnormal   Collection Time: 05/03/24  4:04 AM  Result Value Ref Range   WBC 7.0 4.0 - 10.5 K/uL   RBC 3.71 (L) 3.87 - 5.11 MIL/uL   Hemoglobin 11.8 (L) 12.0 - 15.0 g/dL   HCT 64.1 (L) 63.9 - 53.9 %   MCV 96.5 80.0 - 100.0 fL   MCH 31.8 26.0 - 34.0 pg   MCHC 33.0 30.0 - 36.0 g/dL   RDW 86.0 88.4 - 84.4 %   Platelets 251 150 - 400 K/uL    nRBC 0.0 0.0 - 0.2 %    Comment: Performed at [facility], [address], [place], [place] [address]    CT ABDOMEN PELVIS W CONTRAST Result Date: 05/02/2024 EXAM: CT ABDOMEN AND PELVIS WITH CONTRAST 05/02/2024 07:00:49 AM TECHNIQUE: CT of the abdomen and pelvis was performed with the administration of intravenous contrast. Multiplanar reformatted images are provided for review. Automated exposure control, iterative reconstruction, and/or weight based adjustment of the mA/kV was utilized to reduce the radiation dose to as low as reasonably achievable. COMPARISON: None available. CLINICAL HISTORY: Pancreatitis, acute, severe. Intermittent sharp abdominal pains upper and lower, nausea happened 1 week ago but went away and came back last night. History of c-section. FINDINGS: LOWER CHEST: No acute abnormality. LIVER: The liver is unremarkable. GALLBLADDER AND BILE DUCTS: Diffuse gallbladder wall edema identified which measures up to 9 mm in thickness. No biliary ductal dilatation. No gallstones identified. SPLEEN: No acute abnormality. PANCREAS: There is pancreatic edema with mild peripancreatic soft tissue haziness. No main duct dilatation or mass. No focal fluid collections to suggest pseudocyst. No pancreatic necrosis. ADRENAL GLANDS: No acute abnormality. KIDNEYS, URETERS AND BLADDER: No stones in the kidneys or ureters. No hydronephrosis. No perinephric or periureteral stranding. Urinary bladder is unremarkable. GI AND BOWEL: Moderate stool burden noted throughout the colon. No signs of bowel obstruction or inflammation. No bowel wall thickening. PERITONEUM AND RETROPERITONEUM: No significant free fluid. No focal fluid collections. VASCULATURE: Aorta is normal in caliber. LYMPH NODES: No lymphadenopathy. REPRODUCTIVE ORGANS: No acute abnormality. BONES AND SOFT TISSUES: No acute osseous abnormality. No focal soft tissue abnormality. IMPRESSION: 1. Pancreatic edema with mild  peripancreatic soft tissue haziness, consistent with acute pancreatitis. No pseudocyst or pancreatic necrosis. 2. Diffuse gallbladder wall edema measuring up to 9 mm in thickness. Nonspecific in  this setting of acute pancreatitis. 3. Moderate stool burden throughout the colon. No bowel obstruction or inflammation. Electronically signed by: Waddell Calk MD 05/02/2024 07:29 AM EDT RP Workstation: [id number]     A/P: Kimberly Nelson is an 48 y.o. female with pancreatitis.  Lipase >280 yesterday morning.  Pain has improved clinically. CT with GB wall edema to 9mm.  Cont supportive care and NPO.  Recheck lipase in AM.  She is a bit apprehensive about surgery.  Will discuss further with her tomorrow.    Bernarda JAYSON Ned, MD  Colorectal and General Surgery [facility]  moderate decision making.

## 2024-05-03 NOTE — Plan of Care (Signed)

## 2024-05-03 NOTE — Progress Notes (Signed)
 PROGRESS NOTE    Kimberly Nelson  FMW:[id number] DOB: July 31, 1976 DOA: 05/02/2024 PCP: System, Provider Not In    Brief Narrative:  Kimberly Nelson is a 48 y.o. female with medical history significant for hypertension, depression being admitted to the hospital with acute pancreatitis without necrosis or fluid collection.  States that she had burning epigastric abdominal pain about 1 week ago, it resolved on its own.  Last night, she had recurrence of same burning nonradiating abdominal pain, with associated nausea and vomiting.  No fevers.  Evaluation in the emergency department revealed evidence of pancreatitis with elevated lipase, and pancreatic inflammation without complicating features.  She was given pain and nausea medication, IV fluids, and admitted to the hospitalist service at [facility].  Found to have pancreatitis.  Plan is for eventual cholecystectomy.   Assessment and Plan: Acute pancreatitis-unclear etiology, no significant alcohol history, culprit medications - GI consult appreciated, general surgery consult appreciated - IV fluids, clear liquid diet, pain control -Lipase ordered for the a.m.   Depression -Continue Wellbutrin , Effexor    Hypertension-  - Hold amlodipine , Cozaar  for hypertension  Constipation - Bowel regimen  DVT prophylaxis: enoxaparin  (LOVENOX ) injection 40 mg Start: 05/02/24 2200    Code Status: Full Code Family Communication: At bedside  Disposition Plan:  Level of care: Med-Surg Status is: Inpatient     Consultants:  GI General Surgery  Subjective: Nervous about cholecystectomy but pain is controlled and is hungry  Objective: Vitals:   05/02/24 1536 05/02/24 2220 05/03/24 0414 05/03/24 0924  BP: 114/81 104/66 94/63 113/73  Pulse: 76 79 76   Resp: 15 17 17    Temp: 98.4 F (36.9 C) 98 F (36.7 C) 98 F (36.7 C)   TempSrc:  Oral Oral   SpO2: 99% 97% 100%   Weight:      Height:        Intake/Output Summary (Last 24 hours) at  05/03/2024 1106 Last data filed at 05/03/2024 0749 Gross per 24 hour  Intake 652.09 ml  Output --  Net 652.09 ml   Filed Weights   05/02/24 1131  Weight: 65 kg    Examination:   General: Appearance:     Overweight female in no acute distress     Lungs:      respirations unlabored  Heart:    Normal heart rate. Normal rhythm. No murmurs, rubs, or gallops.    MS:   All extremities are intact.    Neurologic:   Awake, alert       Data Reviewed: I have personally reviewed following labs and imaging studies  CBC: Recent Labs  Lab 05/02/24 0320 05/03/24 0404  WBC 14.0* 7.0  HGB 13.2 11.8*  HCT 37.9 35.8*  MCV 91.5 96.5  PLT 312 251   Basic Metabolic Panel: Recent Labs  Lab 05/02/24 0320 05/03/24 0404  NA 138 135  K 4.0 3.5  CL 102 105  CO2 25 23  GLUCOSE 143* 65*  BUN 14 9  CREATININE 0.83 0.70  CALCIUM 9.5 7.6*   GFR: Estimated Creatinine Clearance: 75.9 mL/min (by C-G formula based on SCr of 0.7 mg/dL). Liver Function Tests: Recent Labs  Lab 05/02/24 0320  AST 412*  ALT 203*  ALKPHOS 74  BILITOT 0.8  PROT 7.6  ALBUMIN 4.8   Recent Labs  Lab 05/02/24 0320  LIPASE >2,800*   No results for input(s): AMMONIA in the last 168 hours. Coagulation Profile: No results for input(s): INR, PROTIME in the last 168 hours. Cardiac  Enzymes: No results for input(s): CKTOTAL, CKMB, CKMBINDEX, TROPONINI in the last 168 hours. BNP (last 3 results) No results for input(s): PROBNP in the last 8760 hours. HbA1C: No results for input(s): HGBA1C in the last 72 hours. CBG: No results for input(s): GLUCAP in the last 168 hours. Lipid Profile: No results for input(s): CHOL, HDL, LDLCALC, TRIG, CHOLHDL, LDLDIRECT in the last 72 hours. Thyroid  Function Tests: No results for input(s): TSH, T4TOTAL, FREET4, T3FREE, THYROIDAB in the last 72 hours. Anemia Panel: No results for input(s): VITAMINB12, FOLATE, FERRITIN, TIBC,  IRON, RETICCTPCT in the last 72 hours. Sepsis Labs: No results for input(s): PROCALCITON, LATICACIDVEN in the last 168 hours.  No results found for this or any previous visit (from the past 240 hours).       Radiology Studies: CT ABDOMEN PELVIS W CONTRAST Result Date: 05/02/2024 EXAM: CT ABDOMEN AND PELVIS WITH CONTRAST 05/02/2024 07:00:49 AM TECHNIQUE: CT of the abdomen and pelvis was performed with the administration of intravenous contrast. Multiplanar reformatted images are provided for review. Automated exposure control, iterative reconstruction, and/or weight based adjustment of the mA/kV was utilized to reduce the radiation dose to as low as reasonably achievable. COMPARISON: None available. CLINICAL HISTORY: Pancreatitis, acute, severe. Intermittent sharp abdominal pains upper and lower, nausea happened 1 week ago but went away and came back last night. History of c-section. FINDINGS: LOWER CHEST: No acute abnormality. LIVER: The liver is unremarkable. GALLBLADDER AND BILE DUCTS: Diffuse gallbladder wall edema identified which measures up to 9 mm in thickness. No biliary ductal dilatation. No gallstones identified. SPLEEN: No acute abnormality. PANCREAS: There is pancreatic edema with mild peripancreatic soft tissue haziness. No main duct dilatation or mass. No focal fluid collections to suggest pseudocyst. No pancreatic necrosis. ADRENAL GLANDS: No acute abnormality. KIDNEYS, URETERS AND BLADDER: No stones in the kidneys or ureters. No hydronephrosis. No perinephric or periureteral stranding. Urinary bladder is unremarkable. GI AND BOWEL: Moderate stool burden noted throughout the colon. No signs of bowel obstruction or inflammation. No bowel wall thickening. PERITONEUM AND RETROPERITONEUM: No significant free fluid. No focal fluid collections. VASCULATURE: Aorta is normal in caliber. LYMPH NODES: No lymphadenopathy. REPRODUCTIVE ORGANS: No acute abnormality. BONES AND SOFT TISSUES: No  acute osseous abnormality. No focal soft tissue abnormality. IMPRESSION: 1. Pancreatic edema with mild peripancreatic soft tissue haziness, consistent with acute pancreatitis. No pseudocyst or pancreatic necrosis. 2. Diffuse gallbladder wall edema measuring up to 9 mm in thickness. Nonspecific in this setting of acute pancreatitis. 3. Moderate stool burden throughout the colon. No bowel obstruction or inflammation. Electronically signed by: Waddell Calk MD 05/02/2024 07:29 AM EDT RP Workstation: [id number]        Scheduled Meds:  buPROPion   300 mg Oral Daily   enoxaparin  (LOVENOX ) injection  40 mg Subcutaneous QHS   loratadine   10 mg Oral QHS   magnesium  gluconate  500 mg Oral Daily   venlafaxine  XR  150 mg Oral Q breakfast   Continuous Infusions:  sodium chloride  150 mL/hr at 05/03/24 0927     LOS: 1 day    Time spent: 45 minutes spent on chart review, discussion with nursing staff, consultants, updating family and interview/physical exam; more than 50% of that time was spent in counseling and/or coordination of care.    Harlene RAYMOND Bowl, DO [facility] Available via Epic secure chat 7am-7pm After these hours, please refer to coverage provider listed on [email or web address] 05/03/2024, 11:06 AM

## 2024-05-04 ENCOUNTER — Inpatient Hospital Stay (HOSPITAL_COMMUNITY): Admitting: Certified Registered Nurse Anesthetist

## 2024-05-04 ENCOUNTER — Inpatient Hospital Stay (HOSPITAL_COMMUNITY)

## 2024-05-04 ENCOUNTER — Encounter (HOSPITAL_COMMUNITY): Payer: Self-pay | Admitting: Internal Medicine

## 2024-05-04 ENCOUNTER — Encounter (HOSPITAL_COMMUNITY)
Admission: EM | Disposition: A | Discharge status: Home / Self Care | Payer: Self-pay | Source: Home / Self Care | Attending: Internal Medicine

## 2024-05-04 ENCOUNTER — Other Ambulatory Visit: Payer: Self-pay

## 2024-05-04 DIAGNOSIS — K8010 Calculus of gallbladder with chronic cholecystitis without obstruction: Secondary | ICD-10-CM

## 2024-05-04 DIAGNOSIS — K859 Acute pancreatitis without necrosis or infection, unspecified: Secondary | ICD-10-CM | POA: Diagnosis not present

## 2024-05-04 HISTORY — PX: CHOLECYSTECTOMY: SHX55

## 2024-05-04 LAB — LIPASE, BLOOD: Lipase: 75 U/L — ABNORMAL HIGH (ref 11–51)

## 2024-05-04 LAB — COMPREHENSIVE METABOLIC PANEL WITH GFR
ALT: 104 U/L — ABNORMAL HIGH (ref 0–44)
AST: 40 U/L (ref 15–41)
Albumin: 3.3 g/dL — ABNORMAL LOW (ref 3.5–5.0)
Alkaline Phosphatase: 47 U/L (ref 38–126)
Anion gap: 8 (ref 5–15)
BUN: <5 mg/dL — ABNORMAL LOW (ref 6–20)
CO2: 21 mmol/L — ABNORMAL LOW (ref 22–32)
Calcium: 8 mg/dL — ABNORMAL LOW (ref 8.9–10.3)
Chloride: 110 mmol/L (ref 98–111)
Creatinine, Ser: 0.5 mg/dL (ref 0.44–1.00)
GFR, Estimated: >60 mL/min (ref >60)
Glucose, Bld: 86 mg/dL (ref 70–99)
Potassium: 3.5 mmol/L (ref 3.5–5.1)
Sodium: 139 mmol/L (ref 135–145)
Total Bilirubin: 0.6 mg/dL (ref 0.0–1.2)
Total Protein: 6 g/dL — ABNORMAL LOW (ref 6.5–8.1)

## 2024-05-04 LAB — CBC
HCT: 34.6 % — ABNORMAL LOW (ref 36.0–46.0)
Hemoglobin: 11.3 g/dL — ABNORMAL LOW (ref 12.0–15.0)
MCH: 31 pg (ref 26.0–34.0)
MCHC: 32.7 g/dL (ref 30.0–36.0)
MCV: 94.8 fL (ref 80.0–100.0)
Platelets: 236 K/uL (ref 150–400)
RBC: 3.65 MIL/uL — ABNORMAL LOW (ref 3.87–5.11)
RDW: 13.3 % (ref 11.5–15.5)
WBC: 6 K/uL (ref 4.0–10.5)
nRBC: 0 % (ref 0.0–0.2)

## 2024-05-04 SURGERY — LAPAROSCOPIC CHOLECYSTECTOMY WITH INTRAOPERATIVE CHOLANGIOGRAM
Anesthesia: General

## 2024-05-04 MED ORDER — PROPOFOL 10 MG/ML IV BOLUS
INTRAVENOUS | Status: AC
Start: 2024-05-04 — End: 2024-05-04
  Filled 2024-05-04: qty 20

## 2024-05-04 MED ORDER — MIDAZOLAM HCL 5 MG/5ML IJ SOLN
INTRAMUSCULAR | Status: DC | PRN
  Administered 2024-05-04: 2 mg via INTRAVENOUS

## 2024-05-04 MED ORDER — LACTATED RINGERS IV SOLN
INTRAVENOUS | Status: DC | PRN
Start: 2024-05-04 — End: 2024-05-04

## 2024-05-04 MED ORDER — SODIUM CHLORIDE 0.9 % IV SOLN
2.0000 g | Freq: Once | INTRAVENOUS | Status: AC
  Administered 2024-05-04: 2 g via INTRAVENOUS
  Filled 2024-05-04: qty 20

## 2024-05-04 MED ORDER — ACETAMINOPHEN 500 MG PO TABS
1000.0000 mg | ORAL_TABLET | Freq: Four times a day (QID) | ORAL | Status: DC
  Administered 2024-05-04 – 2024-05-05 (×3): 1000 mg via ORAL
  Filled 2024-05-04 (×3): qty 2

## 2024-05-04 MED ORDER — BUPIVACAINE-EPINEPHRINE 0.25% -1:200000 IJ SOLN
INTRAMUSCULAR | Status: DC | PRN
  Administered 2024-05-04: 20 mL

## 2024-05-04 MED ORDER — MIDAZOLAM HCL 2 MG/2ML IJ SOLN
INTRAMUSCULAR | Status: AC
Start: 2024-05-04 — End: 2024-05-04
  Filled 2024-05-04: qty 2

## 2024-05-04 MED ORDER — ONDANSETRON HCL 4 MG/2ML IJ SOLN
INTRAMUSCULAR | Status: DC | PRN
  Administered 2024-05-04: 4 mg via INTRAVENOUS

## 2024-05-04 MED ORDER — PROPOFOL 10 MG/ML IV BOLUS
INTRAVENOUS | Status: DC | PRN
  Administered 2024-05-04: 150 mg via INTRAVENOUS

## 2024-05-04 MED ORDER — FENTANYL CITRATE (PF) 100 MCG/2ML IJ SOLN
INTRAMUSCULAR | Status: DC | PRN
  Administered 2024-05-04: 100 ug via INTRAVENOUS
  Administered 2024-05-04 (×2): 50 ug via INTRAVENOUS

## 2024-05-04 MED ORDER — OXYCODONE HCL 5 MG PO TABS
5.0000 mg | ORAL_TABLET | ORAL | Status: DC | PRN
  Administered 2024-05-04: 10 mg via ORAL
  Administered 2024-05-04 – 2024-05-05 (×2): 5 mg via ORAL
  Filled 2024-05-04: qty 1
  Filled 2024-05-04 (×2): qty 2

## 2024-05-04 MED ORDER — PHENYLEPHRINE 80 MCG/ML (10ML) SYRINGE FOR IV PUSH (FOR BLOOD PRESSURE SUPPORT)
PREFILLED_SYRINGE | INTRAVENOUS | Status: AC
  Filled 2024-05-04: qty 10

## 2024-05-04 MED ORDER — ROCURONIUM BROMIDE 10 MG/ML (PF) SYRINGE
PREFILLED_SYRINGE | INTRAVENOUS | Status: DC | PRN
Start: 2024-05-04 — End: 2024-05-04
  Administered 2024-05-04: 50 mg via INTRAVENOUS
  Administered 2024-05-04: 10 mg via INTRAVENOUS

## 2024-05-04 MED ORDER — KETOROLAC TROMETHAMINE 30 MG/ML IJ SOLN
INTRAMUSCULAR | Status: AC
  Filled 2024-05-04: qty 1

## 2024-05-04 MED ORDER — LIDOCAINE HCL (PF) 2 % IJ SOLN
INTRAMUSCULAR | Status: AC
  Filled 2024-05-04: qty 5

## 2024-05-04 MED ORDER — ONDANSETRON HCL 4 MG/2ML IJ SOLN
INTRAMUSCULAR | Status: AC
  Filled 2024-05-04: qty 2

## 2024-05-04 MED ORDER — LACTATED RINGERS IV SOLN: Freq: Once | INTRAVENOUS | Status: AC

## 2024-05-04 MED ORDER — BUPIVACAINE-EPINEPHRINE (PF) 0.25% -1:200000 IJ SOLN
INTRAMUSCULAR | Status: AC
  Filled 2024-05-04: qty 30

## 2024-05-04 MED ORDER — FENTANYL CITRATE (PF) 100 MCG/2ML IJ SOLN
INTRAMUSCULAR | Status: AC
  Filled 2024-05-04: qty 2

## 2024-05-04 MED ORDER — 0.9 % SODIUM CHLORIDE (POUR BTL) OPTIME
TOPICAL | Status: DC | PRN
  Administered 2024-05-04: 1000 mL

## 2024-05-04 MED ORDER — HYDROMORPHONE HCL 1 MG/ML IJ SOLN
0.2500 mg | INTRAMUSCULAR | Status: DC | PRN
  Administered 2024-05-04: 0.5 mg via INTRAVENOUS

## 2024-05-04 MED ORDER — SUGAMMADEX SODIUM 200 MG/2ML IV SOLN
INTRAVENOUS | Status: DC | PRN
  Administered 2024-05-04: 200 mg via INTRAVENOUS

## 2024-05-04 MED ORDER — FENTANYL CITRATE (PF) 100 MCG/2ML IJ SOLN
INTRAMUSCULAR | Status: AC
Start: 2024-05-04 — End: 2024-05-04
  Filled 2024-05-04: qty 2

## 2024-05-04 MED ORDER — KETOROLAC TROMETHAMINE 30 MG/ML IJ SOLN
INTRAMUSCULAR | Status: DC | PRN
  Administered 2024-05-04: 30 mg via INTRAVENOUS

## 2024-05-04 MED ORDER — DEXAMETHASONE SODIUM PHOSPHATE 10 MG/ML IJ SOLN
INTRAMUSCULAR | Status: DC | PRN
Start: 2024-05-04 — End: 2024-05-04
  Administered 2024-05-04: 10 mg via INTRAVENOUS

## 2024-05-04 MED ORDER — LACTATED RINGERS IV SOLN
INTRAVENOUS | Status: AC | PRN
  Administered 2024-05-04: 1000 mL via INTRAVENOUS

## 2024-05-04 MED ORDER — PHENYLEPHRINE 80 MCG/ML (10ML) SYRINGE FOR IV PUSH (FOR BLOOD PRESSURE SUPPORT)
PREFILLED_SYRINGE | INTRAVENOUS | Status: DC | PRN
  Administered 2024-05-04: 80 ug via INTRAVENOUS

## 2024-05-04 MED ORDER — LIDOCAINE HCL (PF) 2 % IJ SOLN
INTRAMUSCULAR | Status: DC | PRN
  Administered 2024-05-04: 60 mg via INTRADERMAL

## 2024-05-04 MED ORDER — INDOCYANINE GREEN 25 MG IV SOLR
1.2500 mg | Freq: Once | INTRAVENOUS | Status: AC
  Administered 2024-05-04: 1.25 mg via INTRAVENOUS
  Filled 2024-05-04: qty 10

## 2024-05-04 MED ORDER — SODIUM CHLORIDE 0.9 % IV SOLN
INTRAVENOUS | Status: DC | PRN
  Administered 2024-05-04: 10 mL

## 2024-05-04 MED ORDER — SUGAMMADEX SODIUM 200 MG/2ML IV SOLN
INTRAVENOUS | Status: AC
  Filled 2024-05-04: qty 2

## 2024-05-04 MED ORDER — DEXAMETHASONE SODIUM PHOSPHATE 10 MG/ML IJ SOLN
INTRAMUSCULAR | Status: AC
Start: 2024-05-04 — End: 2024-05-04
  Filled 2024-05-04: qty 1

## 2024-05-04 MED ORDER — HYDROMORPHONE HCL 1 MG/ML IJ SOLN
INTRAMUSCULAR | Status: AC
  Filled 2024-05-04: qty 1

## 2024-05-04 MED ORDER — CHLORHEXIDINE GLUCONATE 0.12 % MT SOLN
15.0000 mL | Freq: Once | OROMUCOSAL | Status: AC
  Administered 2024-05-04: 15 mL via OROMUCOSAL

## 2024-05-04 MED ORDER — ROCURONIUM BROMIDE 10 MG/ML (PF) SYRINGE
PREFILLED_SYRINGE | INTRAVENOUS | Status: AC
  Filled 2024-05-04: qty 10

## 2024-05-04 SURGICAL SUPPLY — 44 items
APPLICATOR ARISTA FLEXITIP XL (MISCELLANEOUS) IMPLANT
BAG COUNTER SPONGE SURGICOUNT (BAG) IMPLANT
CABLE HIGH FREQUENCY MONO STRZ (ELECTRODE) ×1 IMPLANT
CHLORAPREP W/TINT 26 (MISCELLANEOUS) ×1 IMPLANT
CLIP APPLIE 5 13 M/L LIGAMAX5 (MISCELLANEOUS) IMPLANT
CLIP APPLIE ROT 10 11.4 M/L (STAPLE) IMPLANT
CLIP LIGATING HEMO O LOK GREEN (MISCELLANEOUS) IMPLANT
CLSR STERI-STRIP ANTIMIC 1/2X4 (GAUZE/BANDAGES/DRESSINGS) IMPLANT
COVER MAYO STAND XLG (MISCELLANEOUS) IMPLANT
COVER SURGICAL LIGHT HANDLE (MISCELLANEOUS) ×1 IMPLANT
DERMABOND ADVANCED .7 DNX12 (GAUZE/BANDAGES/DRESSINGS) IMPLANT
DRAPE C-ARM 42X120 X-RAY (DRAPES) IMPLANT
DRSG TEGADERM 2-3/8X2-3/4 SM (GAUZE/BANDAGES/DRESSINGS) ×3 IMPLANT
DRSG TEGADERM 4X4.75 (GAUZE/BANDAGES/DRESSINGS) ×1 IMPLANT
ELECT REM PT RETURN 15FT ADLT (MISCELLANEOUS) ×1 IMPLANT
GAUZE SPONGE 2X2 8PLY STRL LF (GAUZE/BANDAGES/DRESSINGS) ×1 IMPLANT
GAUZE SPONGE 2X2 STRL 8-PLY (GAUZE/BANDAGES/DRESSINGS) IMPLANT
GLOVE BIO SURGEON STRL SZ7.5 (GLOVE) ×1 IMPLANT
GLOVE INDICATOR 8.0 STRL GRN (GLOVE) ×1 IMPLANT
GOWN STRL REUS W/ TWL XL LVL3 (GOWN DISPOSABLE) ×1 IMPLANT
GRASPER SUT TROCAR 14GX15 (MISCELLANEOUS) IMPLANT
HEMOSTAT ARISTA ABSORB 3G PWDR (HEMOSTASIS) IMPLANT
HEMOSTAT SNOW SURGICEL 2X4 (HEMOSTASIS) IMPLANT
IRRIGATION SUCT STRKRFLW 2 WTP (MISCELLANEOUS) ×1 IMPLANT
KIT BASIN OR (CUSTOM PROCEDURE TRAY) ×1 IMPLANT
KIT TURNOVER KIT A (KITS) ×1 IMPLANT
LHOOK LAP DISP 36CM (ELECTROSURGICAL) IMPLANT
POUCH RETRIEVAL ECOSAC 10 (ENDOMECHANICALS) ×1 IMPLANT
SCISSORS LAP 5X35 DISP (ENDOMECHANICALS) ×1 IMPLANT
SET CHOLANGIOGRAPH MIX (MISCELLANEOUS) IMPLANT
SET TUBE SMOKE EVAC HIGH FLOW (TUBING) ×1 IMPLANT
SLEEVE ADV FIXATION 5X100MM (TROCAR) ×1 IMPLANT
SPIKE FLUID TRANSFER (MISCELLANEOUS) ×1 IMPLANT
STRIP CLOSURE SKIN 1/2X4 (GAUZE/BANDAGES/DRESSINGS) ×1 IMPLANT
SUT MNCRL AB 4-0 PS2 18 (SUTURE) ×1 IMPLANT
SUT VIC AB 0 UR5 27 (SUTURE) IMPLANT
SUT VICRYL 0 TIES 12 18 (SUTURE) IMPLANT
SUT VICRYL 0 UR6 27IN ABS (SUTURE) IMPLANT
TOWEL OR 17X26 10 PK STRL BLUE (TOWEL DISPOSABLE) ×1 IMPLANT
TRAY LAPAROSCOPIC (CUSTOM PROCEDURE TRAY) ×1 IMPLANT
TROCAR ADV FIXATION 12X100MM (TROCAR) IMPLANT
TROCAR ADV FIXATION 5X100MM (TROCAR) ×1 IMPLANT
TROCAR BALLN 12MMX100 BLUNT (TROCAR) IMPLANT
TROCAR XCEL NON-BLD 5MMX100MML (ENDOMECHANICALS) IMPLANT

## 2024-05-04 NOTE — Progress Notes (Signed)
 PROGRESS NOTE    Kimberly Nelson  FMW:[id number] DOB: 07/06/1976 DOA: 05/02/2024 PCP: System, Provider Not In    Brief Narrative:  Kimberly Nelson is a 48 y.o. female with medical history significant for hypertension, depression being admitted to the hospital with acute pancreatitis without necrosis or fluid collection.  States that she had burning epigastric abdominal pain about 1 week ago, it resolved on its own.  Last night, she had recurrence of same burning nonradiating abdominal pain, with associated nausea and vomiting.  No fevers.  Evaluation in the emergency department revealed evidence of pancreatitis with elevated lipase, and pancreatic inflammation without complicating features.  She was given pain and nausea medication, IV fluids, and admitted to the hospitalist service at [facility].  Found to have pancreatitis.  Plan is for eventual cholecystectomy.   Assessment and Plan: Acute pancreatitis-unclear etiology, no significant alcohol history, culprit medications - GI consult appreciated, general surgery consult appreciated -Lipase lowered -plan for cholecystectomy today  Depression -Continue Wellbutrin , Effexor    Hypertension-  - Hold amlodipine , Cozaar  for hypertension  Constipation - Bowel regimen for constipation  DVT prophylaxis: enoxaparin  (LOVENOX ) injection 40 mg Start: 05/02/24 2200    Code Status: Full Code Family Communication: At bedside  Disposition Plan:  Level of care: Med-Surg Status is: Inpatient     Consultants:  GI General Surgery  Subjective: Nervous about cholecystectomy but pain is controlled and is hungry  Objective: Vitals:   05/03/24 1220 05/03/24 1948 05/04/24 0407 05/04/24 0958  BP: 121/83 124/82 125/87 128/81  Pulse: 84 85 72 80  Resp: 16 19 17 18   Temp: 98.8 F (37.1 C) 98.7 F (37.1 C) 98.3 F (36.8 C) 98.8 F (37.1 C)  TempSrc: Oral Oral Oral Oral  SpO2: 100% 100% 100% 100%  Weight:    65 kg  Height:    5' 1 (1.549 m)     Intake/Output Summary (Last 24 hours) at 05/04/2024 1149 Last data filed at 05/04/2024 1045 Gross per 24 hour  Intake 3906.05 ml  Output --  Net 3906.05 ml   Filed Weights   05/02/24 1131 05/04/24 0958  Weight: 65 kg 65 kg    Examination:   General: Appearance:     Overweight female in no acute distress     Lungs:      respirations unlabored  Heart:    Normal heart rate    MS:   All extremities are intact.    Neurologic:   Awake, alert       Data Reviewed: I have personally reviewed following labs and imaging studies  CBC: Recent Labs  Lab 05/02/24 0320 05/03/24 0404 05/04/24 0402  WBC 14.0* 7.0 6.0  HGB 13.2 11.8* 11.3*  HCT 37.9 35.8* 34.6*  MCV 91.5 96.5 94.8  PLT 312 251 236   Basic Metabolic Panel: Recent Labs  Lab 05/02/24 0320 05/03/24 0404 05/04/24 0402  NA 138 135 139  K 4.0 3.5 3.5  CL 102 105 110  CO2 25 23 21*  GLUCOSE 143* 65* 86  BUN 14 9 <5*  CREATININE 0.83 0.70 0.50  CALCIUM 9.5 7.6* 8.0*   GFR: Estimated Creatinine Clearance: 75.9 mL/min (by C-G formula based on SCr of 0.5 mg/dL). Liver Function Tests: Recent Labs  Lab 05/02/24 0320 05/04/24 0402  AST 412* 40  ALT 203* 104*  ALKPHOS 74 47  BILITOT 0.8 0.6  PROT 7.6 6.0*  ALBUMIN 4.8 3.3*   Recent Labs  Lab 05/02/24 0320 05/04/24 0402  LIPASE >2,800* 75*  No results for input(s): AMMONIA in the last 168 hours. Coagulation Profile: No results for input(s): INR, PROTIME in the last 168 hours. Cardiac Enzymes: No results for input(s): CKTOTAL, CKMB, CKMBINDEX, TROPONINI in the last 168 hours. BNP (last 3 results) No results for input(s): PROBNP in the last 8760 hours. HbA1C: No results for input(s): HGBA1C in the last 72 hours. CBG: No results for input(s): GLUCAP in the last 168 hours. Lipid Profile: No results for input(s): CHOL, HDL, LDLCALC, TRIG, CHOLHDL, LDLDIRECT in the last 72 hours. Thyroid  Function Tests: No results  for input(s): TSH, T4TOTAL, FREET4, T3FREE, THYROIDAB in the last 72 hours. Anemia Panel: No results for input(s): VITAMINB12, FOLATE, FERRITIN, TIBC, IRON, RETICCTPCT in the last 72 hours. Sepsis Labs: No results for input(s): PROCALCITON, LATICACIDVEN in the last 168 hours.  No results found for this or any previous visit (from the past 240 hours).       Radiology Studies: No results found.       Scheduled Meds:  [MAR Hold] buPROPion   300 mg Oral Daily   [MAR Hold] enoxaparin  (LOVENOX ) injection  40 mg Subcutaneous QHS   [MAR Hold] loratadine   10 mg Oral QHS   [MAR Hold] magnesium  gluconate  500 mg Oral Daily   [MAR Hold] polyethylene glycol  17 g Oral Daily   [MAR Hold] venlafaxine  XR  150 mg Oral Q breakfast   Continuous Infusions:  sodium chloride  75 mL/hr at 05/04/24 0844     LOS: 2 days    Time spent: 45 minutes spent on chart review, discussion with nursing staff, consultants, updating family and interview/physical exam; more than 50% of that time was spent in counseling and/or coordination of care.    Harlene RAYMOND Bowl, DO [facility] Available via Epic secure chat 7am-7pm After these hours, please refer to coverage provider listed on [email or web address] 05/04/2024, 11:49 AM

## 2024-05-04 NOTE — Discharge Instructions (Signed)

## 2024-05-04 NOTE — Plan of Care (Signed)

## 2024-05-04 NOTE — Op Note (Signed)
 Kimberly Nelson [id number] December 17, 1975 05/04/2024  Laparoscopic Cholecystectomy with near infrared fluorescent cholangiography and intraoperative cholangiogram procedure Note  Indications: This patient presents with symptomatic gallbladder disease and will undergo laparoscopic cholecystectomy.  Pre-operative Diagnosis: Gallstone pancreatitis  Post-operative Diagnosis: Same, possible early acute calculus cholecystitis  Surgeon: Camellia Blush MD FACS  Assistants:  Starleen Barrier MD PGY-4   Circulator: Harvey Almarie RAMAN, RN Radiology Technologist: Recchio, Heather  B, RTR Scrub Person: Rama Jacques MALACHI Lark, [place] H, RN; Bernarda Bernardino BROCKS, RN   Anesthesia: General endotracheal anesthesia   Procedure Details  The patient was seen again in the Holding Room. The risks, benefits, complications, treatment options, and expected outcomes were discussed with the patient. The possibilities of reaction to medication, pulmonary aspiration, perforation of viscus, bleeding, recurrent infection, finding a normal gallbladder, the need for additional procedures, failure to diagnose a condition, the possible need to convert to an open procedure, and creating a complication requiring transfusion or operation were discussed with the patient. The likelihood of improving the patient's symptoms with return to their baseline status is good.  The patient and/or family concurred with the proposed plan, giving informed consent. The site of surgery properly noted. The patient was taken to Operating Room, identified as Kimberly Nelson and the procedure verified as Laparoscopic Cholecystectomy with ICG dye.  A Time Out was held and the above information confirmed. Antibiotic prophylaxis was administered.    ICG dye was administered preoperatively.    General endotracheal anesthesia was then administered and tolerated well. After the induction, the abdomen was prepped with Chloraprep and draped in the sterile  fashion. The patient was positioned in the supine position.  Local anesthetic agent was injected into the skin near the umbilicus and an incision made. We dissected down to the abdominal fascia with blunt dissection.  The fascia was incised vertically and we entered the peritoneal cavity bluntly.  A pursestring suture of 0-Vicryl was placed around the fascial opening.  The Hasson cannula was inserted and secured with the stay suture.  Pneumoperitoneum was then created with CO2 and tolerated well without any adverse changes in the patient's vital signs. An 5-mm port was placed in the subxiphoid position.  Two 5-mm ports were placed in the right upper quadrant. All skin incisions were infiltrated with a local anesthetic agent before making the incision and placing the trocars.   We positioned the patient in reverse Trendelenburg, tilted slightly to the patient's left.  The gallbladder was identified, the fundus grasped and retracted cephalad. Adhesions were lysed bluntly and with the electrocautery where indicated, taking care not to injure any adjacent organs or viscus. The infundibulum was grasped and retracted laterally, exposing the peritoneum overlying the triangle of Calot. This was then divided and exposed in a blunt fashion. A critical view of the cystic duct and cystic artery was obtained.  The patient had a prominent right hepatic artery.  We visualized a short cystic artery coming off the right hepatic artery.  I took over dissection of the critical structures at this point  the cystic duct was clearly identified and bluntly dissected circumferentially.  We mobilized the gallbladder both medially and laterally with hook electrocautery in order to achieve a very large critical window.  Utilizing the Stryker camera system near infrared fluorescent activity was visualized in the liver, cystic duct, common hepatic duct and common bile duct.  This served as a secondary confirmation of our anatomy.  An  incision was made in the cystic duct and the  Cook cholangiogram catheter introduced. The catheter was secured using a clip. A cholangiogram was then obtained which showed good visualization of the distal and proximal biliary tree with no sign of filling defects or obstruction.  Contrast flowed easily into the duodenum. The catheter was then removed.   The cystic duct was then ligated with 3 clips on the patient's side and divided. The cystic artery which had been identified & dissected free from the prominent right hepatic artery was ligated with clips and divided as well.   The gallbladder was dissected from the liver bed in retrograde fashion with the electrocautery. there was edema in the gallbladder wall.. The gallbladder was removed and placed in an Ecco sac.  The gallbladder and Ecco sac were then removed through the umbilical port site. The liver bed was irrigated and inspected. Hemostasis was achieved with the electrocautery. Copious irrigation was utilized and was repeatedly aspirated until clear.  The pursestring suture was used to close the umbilical fascia.  An additional interrupted 0 Vicryl was placed at the umbilical fascia using PMI suture passer with laparoscopic guidance.  We again inspected the right upper quadrant for hemostasis.  The umbilical closure was inspected and there was no air leak and nothing trapped within the closure. Pneumoperitoneum was released as we removed the trocars.  4-0 Monocryl was used to close the skin.   steri-Strips, 2 x 2 and Tegaderms were applied. The patient was then extubated and brought to the recovery room in stable condition. Instrument, sponge, and needle counts were correct at closure and at the conclusion of the case.   Findings: Early cholecystitis with Cholelithiasis critical view Infrared fluorescent cholangiography visualized within the common hepatic duct, common bile duct, cystic duct and small bowel IOC showed no filling defect  Estimated  Blood Loss: Minimal         Drains: none         Specimens: Gallbladder           Complications: None; patient tolerated the procedure well.         Disposition: PACU - hemodynamically stable.         Condition: stable  Camellia HERO. Tanda, MD, FACS General, Bariatric, & Minimally Invasive Surgery [facility],  [facility]

## 2024-05-04 NOTE — Transfer of Care (Signed)
 Immediate Anesthesia Transfer of Care Note  Patient: Kimberly Nelson  Procedure(s) Performed: LAPAROSCOPIC CHOLECYSTECTOMY WITH INTRAOPERATIVE CHOLANGIOGRAM  Patient Location: PACU  Anesthesia Type:General  Level of Consciousness: awake, alert , and oriented  Airway & Oxygen Therapy: Patient Spontanous Breathing and Patient connected to face mask oxygen  Post-op Assessment: Report given to RN and Post -op Vital signs reviewed and stable  Post vital signs: Reviewed and stable  Last Vitals:  Vitals Value Taken Time  BP 129/81 05/04/24 12:42  Temp    Pulse 102 05/04/24 12:43  Resp 16 05/04/24 12:43  SpO2 98 % 05/04/24 12:43  Vitals shown include unfiled device data.  Last Pain:  Vitals:   05/04/24 0958  TempSrc: Oral  PainSc: 0-No pain         Complications: No notable events documented.

## 2024-05-04 NOTE — Anesthesia Preprocedure Evaluation (Addendum)
 Anesthesia Evaluation  Patient identified by MRN, date of birth, ID band Patient awake    Reviewed: Allergy & Precautions, H&P , NPO status , Patient's Chart, lab work & pertinent test results  Airway Mallampati: II  TM Distance: >3 FB Neck ROM: Full    Dental no notable dental hx. (+) Teeth Intact, Dental Advisory Given   Pulmonary former smoker   Pulmonary exam normal breath sounds clear to auscultation       Cardiovascular negative cardio ROS  Rhythm:Regular Rate:Normal     Neuro/Psych   Anxiety Depression    negative neurological ROS     GI/Hepatic negative GI ROS, Neg liver ROS,,,  Endo/Other  negative endocrine ROS    Renal/GU negative Renal ROS  negative genitourinary   Musculoskeletal   Abdominal   Peds  Hematology  (+) Blood dyscrasia, anemia   Anesthesia Other Findings   Reproductive/Obstetrics negative OB ROS                              Anesthesia Physical Anesthesia Plan  ASA: 2  Anesthesia Plan: General   Post-op Pain Management: Ofirmev  IV (intra-op)* and Toradol  IV (intra-op)*   Induction: Intravenous  PONV Risk Score and Plan: 4 or greater and Dexamethasone  and Midazolam   Airway Management Planned: Oral ETT  Additional Equipment:   Intra-op Plan:   Post-operative Plan: Extubation in OR  Informed Consent: I have reviewed the patients History and Physical, chart, labs and discussed the procedure including the risks, benefits and alternatives for the proposed anesthesia with the patient or authorized representative who has indicated his/her understanding and acceptance.     Dental advisory given  Plan Discussed with: CRNA  Anesthesia Plan Comments:          Anesthesia Quick Evaluation

## 2024-05-04 NOTE — Progress Notes (Signed)
   05/04/24 2156  TOC Brief Assessment  Insurance and Status Reviewed  Patient has primary care physician No  Home environment has been reviewed home w/ spouse  Prior level of function: independent  Prior/Current Home Services No current home services  Social Drivers of Health Review SDOH reviewed no interventions necessary  Readmission risk has been reviewed Yes  Transition of care needs transition of care needs identified, TOC will continue to follow

## 2024-05-04 NOTE — Anesthesia Postprocedure Evaluation (Signed)
 Anesthesia Post Note  Patient: Kimberly Nelson  Procedure(s) Performed: LAPAROSCOPIC CHOLECYSTECTOMY WITH INTRAOPERATIVE CHOLANGIOGRAM     Patient location during evaluation: PACU Anesthesia Type: General Level of consciousness: awake and alert Pain management: pain level controlled Vital Signs Assessment: post-procedure vital signs reviewed and stable Respiratory status: spontaneous breathing, nonlabored ventilation and respiratory function stable Cardiovascular status: blood pressure returned to baseline and stable Postop Assessment: no apparent nausea or vomiting Anesthetic complications: no   No notable events documented.  Last Vitals:  Vitals:   05/04/24 1300 05/04/24 1315  BP: 125/86   Pulse: 85 79  Resp: 15 13  Temp:    SpO2: 98% 95%    Last Pain:  Vitals:   05/04/24 1315  TempSrc:   PainSc: 6                  Steffon Gladu,W. EDMOND

## 2024-05-04 NOTE — Progress Notes (Signed)
 Progress Note     Subjective: Pt reports minimal RUQ pain but overall pain is significantly improved. Denies nausea or vomiting. Passing flatus, although no BM since Friday. Feels mildly bloated. Husband at bedside. Pt has some concerns about eating/diarrhea after gallbladder surgery which we have discussed. She is agreeable to proceed with cholecystectomy this admission.   Objective: Vital signs in last 24 hours: Temp:  [98.3 F (36.8 C)-98.8 F (37.1 C)] 98.3 F (36.8 C) (08/18 0407) Pulse Rate:  [72-85] 72 (08/18 0407) Resp:  [16-19] 17 (08/18 0407) BP: (113-125)/(73-87) 125/87 (08/18 0407) SpO2:  [100 %] 100 % (08/18 0407) Last BM Date : 05/01/24  Intake/Output from previous day: 08/17 0701 - 08/18 0700 In: 4046.1 [P.O.:240; I.V.:3806.1] Out: -  Intake/Output this shift: No intake/output data recorded.  PE: General: pleasant, WD, WN female who is laying in bed in NAD HEENT: sclera anicteric Heart: regular, rate, and rhythm.   Lungs: No wheezes, rhonchi, or rales noted.  Respiratory effort nonlabored Abd: soft, Mild ttp on deep palpation of RUQ, no epigastric ttp, mild distention, +BS, no masses, hernias, or organomegaly MS: all 4 extremities are symmetrical with no cyanosis, clubbing, or edema. Skin: warm and dry with no masses, lesions, or rashes Neuro: Cranial nerves 2-12 grossly intact, sensation is normal throughout Psych: A&Ox3 with an appropriate affect.    Lab Results:  Recent Labs    05/03/24 0404 05/04/24 0402  WBC 7.0 6.0  HGB 11.8* 11.3*  HCT 35.8* 34.6*  PLT 251 236   BMET Recent Labs    05/03/24 0404 05/04/24 0402  NA 135 139  K 3.5 3.5  CL 105 110  CO2 23 21*  GLUCOSE 65* 86  BUN 9 <5*  CREATININE 0.70 0.50  CALCIUM 7.6* 8.0*   PT/INR No results for input(s): LABPROT, INR in the last 72 hours. CMP     Component Value Date/Time   NA 139 05/04/2024 0402   K 3.5 05/04/2024 0402   CL 110 05/04/2024 0402   CO2 21 (L) 05/04/2024  0402   GLUCOSE 86 05/04/2024 0402   BUN <5 (L) 05/04/2024 0402   CREATININE 0.50 05/04/2024 0402   CALCIUM 8.0 (L) 05/04/2024 0402   PROT 6.0 (L) 05/04/2024 0402   ALBUMIN 3.3 (L) 05/04/2024 0402   AST 40 05/04/2024 0402   ALT 104 (H) 05/04/2024 0402   ALKPHOS 47 05/04/2024 0402   BILITOT 0.6 05/04/2024 0402   GFRNONAA >60 05/04/2024 0402   GFRAA >90 07/05/2014 0800   Lipase     Component Value Date/Time   LIPASE 75 (H) 05/04/2024 0402       Studies/Results: No results found.  Anti-infectives: Anti-infectives (From admission, onward)    Start     Dose/Rate Route Frequency Ordered Stop   05/04/24 1000  cefTRIAXone  (ROCEPHIN ) 2 g in sodium chloride  0.9 % 100 mL IVPB        2 g 200 mL/hr over 30 Minutes Intravenous  Once 05/04/24 9095          Assessment/Plan  Biliary pancreatitis  -  CT AP 8/16 with pancreatic edema but no pseudocyst or necrosis, diffuse gallbladder wall thickening, moderate stool burden - lipase >2800 on admit, 75 this AM. LFTs all trending down as well - mild ttp in RUQ but otherwise non-tender - keep NPO - I have explained the procedure, risks, and aftercare of Laparoscopic cholecystectomy  with IOC.  Risks include but are not limited to anesthesia (MI, CVA, death, prolonged intubation  and aspiration), bleeding, infection, wound problems, hernia, bile leak, injury to common bile duct/liver/intestine, possible need for subtotal cholecystectomy or open cholecystectomy, increased risk of DVT/PE and diarrhea post op.  She seems to understand and agrees to proceed.   FEN: NPO, IVF  VTE: LMWH ID: Rocephin  ordered for pre-op    LOS: 2 days   I reviewed Consultant GI notes, hospitalist notes, last 24 h vitals and pain scores, last 48 h intake and output, last 24 h labs and trends, and last 24 h imaging results.  This care required high  level of medical decision making.    Burnard JONELLE Louder, [facility] 05/04/2024, 9:07 AM Please  see Amion for pager number during day hours 7:00am-4:30pm

## 2024-05-04 NOTE — Anesthesia Procedure Notes (Signed)
 Procedure Name: Intubation Date/Time: 05/04/2024 10:43 AM  Performed by: Zulema Leita PARAS, CRNAPre-anesthesia Checklist: Patient identified, Emergency Drugs available, Suction available and Patient being monitored Patient Re-evaluated:Patient Re-evaluated prior to induction Oxygen Delivery Method: Circle system utilized Preoxygenation: Pre-oxygenation with 100% oxygen Induction Type: IV induction Ventilation: Mask ventilation without difficulty Laryngoscope Size: Mac and 4 Grade View: Grade I Tube type: Oral Tube size: 7.0 mm Number of attempts: 1 Airway Equipment and Method: Stylet Placement Confirmation: ETT inserted through vocal cords under direct vision, positive ETCO2 and breath sounds checked- equal and bilateral Secured at: 21 cm Tube secured with: Tape Dental Injury: Teeth and Oropharynx as per pre-operative assessment

## 2024-05-05 ENCOUNTER — Encounter (HOSPITAL_COMMUNITY): Payer: Self-pay | Admitting: General Surgery

## 2024-05-05 DIAGNOSIS — K859 Acute pancreatitis without necrosis or infection, unspecified: Secondary | ICD-10-CM | POA: Diagnosis not present

## 2024-05-05 LAB — CBC
HCT: 33.9 % — ABNORMAL LOW (ref 36.0–46.0)
Hemoglobin: 10.9 g/dL — ABNORMAL LOW (ref 12.0–15.0)
MCH: 30.5 pg (ref 26.0–34.0)
MCHC: 32.2 g/dL (ref 30.0–36.0)
MCV: 95 fL (ref 80.0–100.0)
Platelets: 267 K/uL (ref 150–400)
RBC: 3.57 MIL/uL — ABNORMAL LOW (ref 3.87–5.11)
RDW: 13.3 % (ref 11.5–15.5)
WBC: 9.7 K/uL (ref 4.0–10.5)
nRBC: 0 % (ref 0.0–0.2)

## 2024-05-05 LAB — COMPREHENSIVE METABOLIC PANEL WITH GFR
ALT: 84 U/L — ABNORMAL HIGH (ref 0–44)
AST: 35 U/L (ref 15–41)
Albumin: 3.4 g/dL — ABNORMAL LOW (ref 3.5–5.0)
Alkaline Phosphatase: 45 U/L (ref 38–126)
Anion gap: 8 (ref 5–15)
BUN: 6 mg/dL (ref 6–20)
CO2: 20 mmol/L — ABNORMAL LOW (ref 22–32)
Calcium: 8.3 mg/dL — ABNORMAL LOW (ref 8.9–10.3)
Chloride: 110 mmol/L (ref 98–111)
Creatinine, Ser: 0.7 mg/dL (ref 0.44–1.00)
GFR, Estimated: >60 mL/min (ref >60)
Glucose, Bld: 99 mg/dL (ref 70–99)
Potassium: 3.5 mmol/L (ref 3.5–5.1)
Sodium: 138 mmol/L (ref 135–145)
Total Bilirubin: 0.2 mg/dL (ref 0.0–1.2)
Total Protein: 6 g/dL — ABNORMAL LOW (ref 6.5–8.1)

## 2024-05-05 LAB — SURGICAL PATHOLOGY

## 2024-05-05 LAB — GLUCOSE, CAPILLARY: Glucose-Capillary: 72 mg/dL (ref 70–99)

## 2024-05-05 MED ORDER — TRAMADOL HCL 50 MG PO TABS
50.0000 mg | ORAL_TABLET | Freq: Once | ORAL | Status: AC
  Administered 2024-05-05: 50 mg via ORAL
  Filled 2024-05-05: qty 1

## 2024-05-05 MED ORDER — OXYCODONE HCL 5 MG PO TABS: 5.0000 mg | ORAL_TABLET | ORAL | 0 refills | Status: AC | PRN

## 2024-05-05 MED ORDER — SENNOSIDES-DOCUSATE SODIUM 8.6-50 MG PO TABS: 1.0000 | ORAL_TABLET | Freq: Every evening | ORAL | Status: AC | PRN

## 2024-05-05 MED ORDER — ACETAMINOPHEN 500 MG PO TABS: 1000.0000 mg | ORAL_TABLET | Freq: Four times a day (QID) | ORAL | Status: AC | PRN

## 2024-05-05 MED ORDER — TRAMADOL HCL 50 MG PO TABS: 50.0000 mg | ORAL_TABLET | Freq: Four times a day (QID) | ORAL | 0 refills | Status: AC | PRN

## 2024-05-05 MED ORDER — POLYETHYLENE GLYCOL 3350 17 G PO PACK: 17.0000 g | PACK | Freq: Every day | ORAL | Status: AC

## 2024-05-05 NOTE — Discharge Summary (Signed)
 Physician Discharge Summary  Kimberly Nelson FMW:[id number] DOB: [birth year] DOA: 05/02/2024  PCP: System, Provider Not In  Admit date: 05/02/2024 Discharge date: 05/05/2024  Admitted From: Home Discharge disposition: Home   Recommendations for Outpatient Follow-Up:   Blood pressure medications held due to lower blood pressure-patient given instructions of when to resume   Discharge Diagnosis:   Principal Problem:   Acute pancreatitis    Discharge Condition: Improved.  Diet recommendation: Regular.  Wound care: None.  Code status: Full.   History of Present Illness:   Kimberly Nelson is a 48 y.o. female with medical history significant for hypertension, depression being admitted to the hospital with acute pancreatitis without necrosis or fluid collection.  States that she had burning epigastric abdominal pain about 1 week ago, it resolved on its own.  Last night, she had recurrence of same burning nonradiating abdominal pain, with associated nausea and vomiting.  No fevers.  Evaluation in the emergency department revealed evidence of pancreatitis with elevated lipase, and pancreatic inflammation without complicating features.  She was given pain and nausea medication, IV fluids, and admitted to the hospitalist service at [facility].     Hospital Course by Problem:    Biliary pancreatitis  POD1 s/p laparoscopic cholecystectomy with IOC Dr. Tanda - no further abx needed  Depression -Continue Wellbutrin , Effexor    Hypertension-  - Hold amlodipine , Cozaar  for low BP   Constipation - Bowel regimen for constipation  Medical Consultants:   General Surgery   Discharge Exam:   Vitals:   05/05/24 0257 05/05/24 0444  BP: 117/79 106/74  Pulse: 80 85  Resp: 18 18  Temp: 97.9 F (36.6 C) 98.3 F (36.8 C)  SpO2: 98% 97%   Vitals:   05/04/24 1828 05/04/24 2300 05/05/24 0257 05/05/24 0444  BP: 123/71 116/86 117/79 106/74  Pulse: 88 81 80 85  Resp: 16 18 18 18    Temp: 98.1 F (36.7 C) 98.8 F (37.1 C) 97.9 F (36.6 C) 98.3 F (36.8 C)  TempSrc: Oral Oral Oral Oral  SpO2: 100% 98% 98% 97%  Weight:      Height:        General exam: Appears calm and comfortable.    The results of significant diagnostics from this hospitalization (including imaging, microbiology, ancillary and laboratory) are listed below for reference.     Procedures and Diagnostic Studies:   CT ABDOMEN PELVIS W CONTRAST Result Date: 05/02/2024 EXAM: CT ABDOMEN AND PELVIS WITH CONTRAST 05/02/2024 07:00:49 AM TECHNIQUE: CT of the abdomen and pelvis was performed with the administration of intravenous contrast. Multiplanar reformatted images are provided for review. Automated exposure control, iterative reconstruction, and/or weight based adjustment of the mA/kV was utilized to reduce the radiation dose to as low as reasonably achievable. COMPARISON: None available. CLINICAL HISTORY: Pancreatitis, acute, severe. Intermittent sharp abdominal pains upper and lower, nausea happened 1 week ago but went away and came back last night. History of c-section. FINDINGS: LOWER CHEST: No acute abnormality. LIVER: The liver is unremarkable. GALLBLADDER AND BILE DUCTS: Diffuse gallbladder wall edema identified which measures up to 9 mm in thickness. No biliary ductal dilatation. No gallstones identified. SPLEEN: No acute abnormality. PANCREAS: There is pancreatic edema with mild peripancreatic soft tissue haziness. No main duct dilatation or mass. No focal fluid collections to suggest pseudocyst. No pancreatic necrosis. ADRENAL GLANDS: No acute abnormality. KIDNEYS, URETERS AND BLADDER: No stones in the kidneys or ureters. No hydronephrosis. No perinephric or periureteral stranding. Urinary bladder is  unremarkable. GI AND BOWEL: Moderate stool burden noted throughout the colon. No signs of bowel obstruction or inflammation. No bowel wall thickening. PERITONEUM AND RETROPERITONEUM: No significant free  fluid. No focal fluid collections. VASCULATURE: Aorta is normal in caliber. LYMPH NODES: No lymphadenopathy. REPRODUCTIVE ORGANS: No acute abnormality. BONES AND SOFT TISSUES: No acute osseous abnormality. No focal soft tissue abnormality. IMPRESSION: 1. Pancreatic edema with mild peripancreatic soft tissue haziness, consistent with acute pancreatitis. No pseudocyst or pancreatic necrosis. 2. Diffuse gallbladder wall edema measuring up to 9 mm in thickness. Nonspecific in this setting of acute pancreatitis. 3. Moderate stool burden throughout the colon. No bowel obstruction or inflammation. Electronically signed by: Waddell Calk MD 05/02/2024 07:29 AM EDT RP Workstation: [id number]     Labs:   Basic Metabolic Panel: Recent Labs  Lab 05/02/24 0320 05/03/24 0404 05/04/24 0402 05/05/24 0450  NA 138 135 139 138  K 4.0 3.5 3.5 3.5  CL 102 105 110 110  CO2 25 23 21* 20*  GLUCOSE 143* 65* 86 99  BUN 14 9 <5* 6  CREATININE 0.83 0.70 0.50 0.70  CALCIUM 9.5 7.6* 8.0* 8.3*   GFR Estimated Creatinine Clearance: 75.9 mL/min (by C-G formula based on SCr of 0.7 mg/dL). Liver Function Tests: Recent Labs  Lab 05/02/24 0320 05/04/24 0402 05/05/24 0450  AST 412* 40 35  ALT 203* 104* 84*  ALKPHOS 74 47 45  BILITOT 0.8 0.6 0.2  PROT 7.6 6.0* 6.0*  ALBUMIN 4.8 3.3* 3.4*   Recent Labs  Lab 05/02/24 0320 05/04/24 0402  LIPASE >2,800* 75*   No results for input(s): AMMONIA in the last 168 hours. Coagulation profile No results for input(s): INR, PROTIME in the last 168 hours.  CBC: Recent Labs  Lab 05/02/24 0320 05/03/24 0404 05/04/24 0402 05/05/24 0450  WBC 14.0* 7.0 6.0 9.7  HGB 13.2 11.8* 11.3* 10.9*  HCT 37.9 35.8* 34.6* 33.9*  MCV 91.5 96.5 94.8 95.0  PLT 312 251 236 267   Cardiac Enzymes: No results for input(s): CKTOTAL, CKMB, CKMBINDEX, TROPONINI in the last 168 hours. BNP: Invalid input(s): POCBNP CBG: Recent Labs  Lab 05/05/24 0806  GLUCAP 72    D-Dimer No results for input(s): DDIMER in the last 72 hours. Hgb A1c No results for input(s): HGBA1C in the last 72 hours. Lipid Profile No results for input(s): CHOL, HDL, LDLCALC, TRIG, CHOLHDL, LDLDIRECT in the last 72 hours. Thyroid  function studies No results for input(s): TSH, T4TOTAL, T3FREE, THYROIDAB in the last 72 hours.  Invalid input(s): FREET3 Anemia work up No results for input(s): VITAMINB12, FOLATE, FERRITIN, TIBC, IRON, RETICCTPCT in the last 72 hours. Microbiology No results found for this or any previous visit (from the past 240 hours).   Discharge Instructions:   Discharge Instructions     Diet general   Complete by: As directed    Increase activity slowly   Complete by: As directed       Allergies as of 05/05/2024       Reactions   Penicillins Hives   Sulfa Antibiotics Hives        Medication List     PAUSE taking these medications    amLODipine  5 MG tablet Wait to take this until your doctor or other care provider tells you to start again. Commonly known as: NORVASC  Take 5 mg by mouth daily.   losartan  50 MG tablet Wait to take this until your doctor or other care provider tells you to start again. Commonly known as: COZAAR  Take  50 mg by mouth daily.       TAKE these medications    acetaminophen  500 MG tablet Commonly known as: TYLENOL  Take 2 tablets (1,000 mg total) by mouth every 6 (six) hours as needed for mild pain (pain score 1-3), fever or headache.   buPROPion  300 MG 24 hr tablet Commonly known as: WELLBUTRIN  XL Take 300 mg by mouth daily.   cyanocobalamin 100 MCG tablet Commonly known as: VITAMIN B12 Take 200 mcg by mouth daily.   ergocalciferol 1.25 MG (50000 UT) capsule Commonly known as: VITAMIN D2 Take 50,000 Units by mouth once a week. Tues   loratadine  10 MG tablet Commonly known as: CLARITIN  Take 10 mg by mouth at bedtime.   magnesium  gluconate 500 (27 Mg) MG Tabs  tablet Commonly known as: MAGONATE Take 500 mg by mouth in the morning and at bedtime.   oxyCODONE  5 MG immediate release tablet Commonly known as: Oxy IR/ROXICODONE  Take 1-2 tablets (5-10 mg total) by mouth every 4 (four) hours as needed for moderate pain (pain score 4-6) or severe pain (pain score 7-10) (5 mg for moderate, 10 mg for severe).   polyethylene glycol 17 g packet Commonly known as: MIRALAX  / GLYCOLAX  Take 17 g by mouth daily. Start taking on: May 06, 2024   senna-docusate 8.6-50 MG tablet Commonly known as: Senokot-S Take 1 tablet by mouth at bedtime as needed for mild constipation.   traMADol  50 MG tablet Commonly known as: Ultram  Take 1 tablet (50 mg total) by mouth every 6 (six) hours as needed for moderate pain (pain score 4-6) or severe pain (pain score 7-10).   venlafaxine  XR 150 MG 24 hr capsule Commonly known as: EFFEXOR -XR Take 150 mg by mouth daily with breakfast.        Follow-up Information     Maczis, Puja Gosai, PA-C. Go on 05/28/2024.   Specialty: General Surgery Why: 1:30 PM, please arrive 30 min prior to appointment time to check in. Contact information: [address] [facility] [place] [address] [phone]                  Time coordinating discharge: 45 minutes  Signed:  Harlene RAYMOND Bowl DO  [facility] 05/05/2024, 3:03 PM

## 2024-05-05 NOTE — Progress Notes (Signed)
 Discharge instructions given to patient questions asked and answered. Sylvia Everts RN

## 2024-05-05 NOTE — Progress Notes (Signed)
 Progress Note  1 Day Post-Op  Subjective: Pt reports abdominal soreness but overall well controlled. Some drainage from incision at umbilicus overnight. Denies nausea, passing flatus. No BM but sipping miralax  already this AM. Husband at bedside. Reviewed post-op care and instructions.   Objective: Vital signs in last 24 hours: Temp:  [96.8 F (36 C)-98.8 F (37.1 C)] 98.3 F (36.8 C) (08/19 0444) Pulse Rate:  [79-104] 85 (08/19 0444) Resp:  [12-19] 18 (08/19 0444) BP: (106-129)/(71-88) 106/74 (08/19 0444) SpO2:  [94 %-100 %] 97 % (08/19 0444) Last BM Date : 05/01/24  Intake/Output from previous day: 08/18 0701 - 08/19 0700 In: 1600 [I.V.:1500; IV Piggyback:100] Out: 20 [Blood:20] Intake/Output this shift: Total I/O In: 240 [P.O.:240] Out: -   PE: General: pleasant, WD, WN female who is laying in bed in NAD HEENT: sclera anicteric  Heart: regular, rate, and rhythm.  Lungs:  Respiratory effort nonlabored Abd: soft, appropriately ttp, incisions C/D/I, mild distention  Psych: A&Ox3 with an appropriate affect.    Lab Results:  Recent Labs    05/04/24 0402 05/05/24 0450  WBC 6.0 9.7  HGB 11.3* 10.9*  HCT 34.6* 33.9*  PLT 236 267   BMET Recent Labs    05/04/24 0402 05/05/24 0450  NA 139 138  K 3.5 3.5  CL 110 110  CO2 21* 20*  GLUCOSE 86 99  BUN <5* 6  CREATININE 0.50 0.70  CALCIUM 8.0* 8.3*   PT/INR No results for input(s): LABPROT, INR in the last 72 hours. CMP     Component Value Date/Time   NA 138 05/05/2024 0450   K 3.5 05/05/2024 0450   CL 110 05/05/2024 0450   CO2 20 (L) 05/05/2024 0450   GLUCOSE 99 05/05/2024 0450   BUN 6 05/05/2024 0450   CREATININE 0.70 05/05/2024 0450   CALCIUM 8.3 (L) 05/05/2024 0450   PROT 6.0 (L) 05/05/2024 0450   ALBUMIN 3.4 (L) 05/05/2024 0450   AST 35 05/05/2024 0450   ALT 84 (H) 05/05/2024 0450   ALKPHOS 45 05/05/2024 0450   BILITOT 0.2 05/05/2024 0450   GFRNONAA >60 05/05/2024 0450   GFRAA >90  07/05/2014 0800   Lipase     Component Value Date/Time   LIPASE 75 (H) 05/04/2024 0402       Studies/Results: DG Cholangiogram Operative Result Date: 05/04/2024 CLINICAL DATA:  Cholecystectomy EXAM: INTRAOPERATIVE CHOLANGIOGRAM TECHNIQUE: Cholangiographic images from the C-arm fluoroscopic device were submitted for interpretation post-operatively. Please see the procedural report for the amount of contrast and the fluoroscopy time utilized. FLUOROSCOPY: Radiation Exposure Index (as provided by the fluoroscopic device): 6.8 mGy Kerma COMPARISON:  05/02/2024 FINDINGS: Fluoroscopic imaging was performed during the performance of the procedure with images provided for interpretation only. Cholecystectomy clips are identified. Cannulation and opacification of the intrahepatic and extrahepatic biliary tree demonstrates no filling defects. Contrast is seen emptying into the duodenum. Please refer to operative report. IMPRESSION: 1. Unremarkable intraoperative cholangiogram. Please refer to the operative report. Electronically Signed   By: Ozell Daring M.D.   On: 05/04/2024 16:04    Anti-infectives: Anti-infectives (From admission, onward)    Start     Dose/Rate Route Frequency Ordered Stop   05/04/24 1000  cefTRIAXone  (ROCEPHIN ) 2 g in sodium chloride  0.9 % 100 mL IVPB        2 g 200 mL/hr over 30 Minutes Intravenous  Once 05/04/24 0904 05/04/24 1421        Assessment/Plan  Biliary pancreatitis  POD1 s/p laparoscopic cholecystectomy  with IOC Dr. Tanda - no further abx needed - tolerating diet and passing flatus, no nausea - no BM in several days but getting senna and miralax  - some drainage from umbilical incision overnight, appears clean and dry this AM - stable for DC home from surgery perspective, follow up and instructions in AVS. Rx for tramadol  sent to pharmacy   FEN: reg diet VTE: LMWH ID: [id number]  pre-op    LOS: 3 days     Burnard JONELLE Louder, [facility]  Surgery 05/05/2024, 10:02 AM Please see Amion for pager number during day hours 7:00am-4:30pm

## 2024-05-05 NOTE — Plan of Care (Signed)

## 2024-05-27 NOTE — Telephone Encounter (Signed)
 Spoke to pt states she didn't write them down states the top is 140 bottom is running 90-100

## 2024-05-27 NOTE — Telephone Encounter (Signed)
 Attempted to call pt, no answer LVM to return call    Live Answer: When patient returns call, please communicate the follow information:  Live Answer can communication the following information:      Please relay message

## 2024-05-28 NOTE — Progress Notes (Signed)
   PROVIDER:  PUJA GOSAI MACZIS, PA  MRN: [id number] DOB: 02/20/1976 DATE OF ENCOUNTER: 05/28/2024 History of Present Illness:   Kimberly Nelson is a 48 y.o. female  who underwent emergent laparoscopic cholecystectomy on 05/04/2024 by Dr. Tanda.  Discharged post-op day 1.  Denies abdominal pain and is not taking narcotic or OTC pain medication.  Tolerating diet well and denies diarrhea and constipation.  Denies issues with urination.  Denies fever, nausea, or vomiting.  She states her blood pressure medications were discontinued in the hospital because her blood pressure was low.  She has not taken any today.  She has been discussing this with her PCP.  Review of Systems:   A complete review of systems was obtained from the patient.  I have reviewed this information and discussed as appropriate with the patient.  See HPI as well for other ROS.  ROS All other review of systems negative.  Medications:   Current Outpatient Medications on File Prior to Visit  Medication Sig Dispense Refill  . buPROPion  (WELLBUTRIN  XL) 300 MG XL tablet Take 300 mg by mouth every morning    . cyanocobalamin (VITAMIN B12) 100 MCG tablet Take 200 mcg by mouth once daily    . ergocalciferol, vitamin D2, 1,250 mcg (50,000 unit) capsule Take 50,000 Units by mouth once a week    . loratadine  (CLARITIN ) 10 mg tablet Take 10 mg by mouth at bedtime    . magnesium  gluconate (MAG-G) 27 mg (500 mg) tablet Take 500 mg by mouth 2 (two) times daily    . venlafaxine  (EFFEXOR -XR) 150 MG XR capsule Take 150 mg by mouth once daily     No current facility-administered medications on file prior to visit.    Physical Examination:   BP (!) 156/109 Comment: Provider aware  Pulse 87   Temp 36.8 C (98.3 F) (Temporal)   Ht 156.2 cm (5' 1.5)   Wt 63 kg (139 lb)   SpO2 98%   BMI 25.84 kg/m  General: Alert, oriented, in no acute distress Pulmonary: Effort normal Abdomen: Soft, non-tender, non-distended abdomen. Incisions  clean, dry, and intact without surrounding erythema and drainage  Assessment and Plan:   Diagnoses and all orders for this visit:  Status post laparoscopic cholecystectomy    Kimberly Nelson is doing well and healing appropriately without signs of infection.  Pathology report reviewed with patient.  We discussed gradually increasing activities as tolerated.    Follow-up as needed.   Puja Maczis, [facility]
